# Patient Record
Sex: Female | Born: 1980 | Race: White | Hispanic: No | Marital: Married | Smoking: Never smoker
Health system: Southern US, Community
[De-identification: ages and names within clinical notes are randomized; demographics above are authoritative.]

## PROBLEM LIST (undated history)

## (undated) DIAGNOSIS — K429 Umbilical hernia without obstruction or gangrene: Secondary | ICD-10-CM

## (undated) HISTORY — PX: FINGER SURGERY: SHX640

---
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
## 2004-03-09 ENCOUNTER — Ambulatory Visit (HOSPITAL_COMMUNITY): Admission: RE | Admit: 2004-03-09 | Discharge: 2004-03-09 | Payer: Self-pay | Admitting: Family Medicine

## 2004-08-03 ENCOUNTER — Other Ambulatory Visit: Admission: RE | Admit: 2004-08-03 | Discharge: 2004-08-03 | Payer: Self-pay | Admitting: Family Medicine

## 2004-08-20 HISTORY — PX: APPENDECTOMY: SHX54

## 2005-02-28 ENCOUNTER — Encounter: Admission: RE | Admit: 2005-02-28 | Discharge: 2005-02-28 | Payer: Self-pay | Admitting: Surgery

## 2005-03-01 ENCOUNTER — Ambulatory Visit (HOSPITAL_COMMUNITY): Admission: RE | Admit: 2005-03-01 | Discharge: 2005-03-01 | Payer: Self-pay | Admitting: Surgery

## 2005-03-06 ENCOUNTER — Encounter: Admission: RE | Admit: 2005-03-06 | Discharge: 2005-03-06 | Payer: Self-pay | Admitting: Surgery

## 2005-08-10 ENCOUNTER — Other Ambulatory Visit: Admission: RE | Admit: 2005-08-10 | Discharge: 2005-08-10 | Payer: Self-pay | Admitting: Family Medicine

## 2006-04-30 ENCOUNTER — Ambulatory Visit (HOSPITAL_BASED_OUTPATIENT_CLINIC_OR_DEPARTMENT_OTHER): Admission: RE | Admit: 2006-04-30 | Discharge: 2006-04-30 | Payer: Self-pay | Admitting: Orthopedic Surgery

## 2006-08-12 ENCOUNTER — Other Ambulatory Visit: Admission: RE | Admit: 2006-08-12 | Discharge: 2006-08-12 | Payer: Self-pay | Admitting: Family Medicine

## 2007-08-18 ENCOUNTER — Other Ambulatory Visit: Admission: RE | Admit: 2007-08-18 | Discharge: 2007-08-18 | Payer: Self-pay | Admitting: Family Medicine

## 2008-08-27 ENCOUNTER — Other Ambulatory Visit: Admission: RE | Admit: 2008-08-27 | Discharge: 2008-08-27 | Payer: Self-pay | Admitting: Family Medicine

## 2009-08-29 ENCOUNTER — Other Ambulatory Visit: Admission: RE | Admit: 2009-08-29 | Discharge: 2009-08-29 | Payer: Self-pay | Admitting: Family Medicine

## 2011-09-21 ENCOUNTER — Other Ambulatory Visit (HOSPITAL_COMMUNITY)
Admission: RE | Admit: 2011-09-21 | Discharge: 2011-09-21 | Disposition: A | Discharge status: Home / Self Care | Payer: BC Managed Care – PPO | Source: Ambulatory Visit | Attending: Family Medicine | Admitting: Family Medicine

## 2011-09-21 DIAGNOSIS — Z0000 Encounter for general adult medical examination without abnormal findings: Secondary | ICD-10-CM | POA: Insufficient documentation

## 2012-10-22 ENCOUNTER — Other Ambulatory Visit: Payer: Self-pay | Admitting: Family Medicine

## 2014-03-04 ENCOUNTER — Other Ambulatory Visit (HOSPITAL_COMMUNITY): Payer: Self-pay | Admitting: Obstetrics and Gynecology

## 2014-03-04 DIAGNOSIS — N979 Female infertility, unspecified: Secondary | ICD-10-CM

## 2014-03-09 ENCOUNTER — Ambulatory Visit (HOSPITAL_COMMUNITY)
Admission: RE | Admit: 2014-03-09 | Discharge: 2014-03-09 | Disposition: A | Discharge status: Home / Self Care | Payer: BC Managed Care – PPO | Source: Ambulatory Visit | Attending: Obstetrics and Gynecology | Admitting: Obstetrics and Gynecology

## 2014-03-09 DIAGNOSIS — N979 Female infertility, unspecified: Secondary | ICD-10-CM | POA: Insufficient documentation

## 2014-03-09 MED ORDER — IOHEXOL 300 MG/ML  SOLN
20.0000 mL | Freq: Once | INTRAMUSCULAR | Status: AC | PRN
  Administered 2014-03-09: 18 mL

## 2014-09-04 ENCOUNTER — Emergency Department (HOSPITAL_COMMUNITY)
Admission: EM | Admit: 2014-09-04 | Discharge: 2014-09-04 | Disposition: A | Discharge status: Home / Self Care | Payer: BC Managed Care – PPO | Source: Home / Self Care | Attending: Emergency Medicine | Admitting: Emergency Medicine

## 2014-09-04 ENCOUNTER — Encounter (HOSPITAL_COMMUNITY): Payer: Self-pay | Admitting: Emergency Medicine

## 2014-09-04 DIAGNOSIS — N390 Urinary tract infection, site not specified: Secondary | ICD-10-CM

## 2014-09-04 LAB — POCT URINALYSIS DIP (DEVICE)
Bilirubin Urine: NEGATIVE
Glucose, UA: NEGATIVE mg/dL
Ketones, ur: NEGATIVE mg/dL
NITRITE: POSITIVE — AB
PH: 5.5 (ref 5.0–8.0)
Protein, ur: 30 mg/dL — AB
Specific Gravity, Urine: 1.01 (ref 1.005–1.030)
Urobilinogen, UA: 0.2 mg/dL (ref 0.0–1.0)

## 2014-09-04 MED ORDER — CEPHALEXIN 500 MG PO CAPS: 500.0000 mg | ORAL_CAPSULE | Freq: Two times a day (BID) | ORAL | Status: DC

## 2014-09-04 NOTE — ED Provider Notes (Signed)
CSN: [id number]     Arrival date & time 09/04/14  1836 History   First MD Initiated Contact with Patient 09/04/14 1847     Chief Complaint  Patient presents with  . Back Pain  . Urinary Tract Infection   (Consider location/radiation/quality/duration/timing/severity/associated sxs/prior Treatment) HPI Comments: Teresa Roberson presents [redacted] weeks pregnant with dysuria, odor, frequency and upper back pain. Denies fever, chills or abdominal pain.   Patient is a 34 y.o. female presenting with back pain and urinary tract infection. The history is provided by the patient.  Back Pain Urinary Tract Infection    History reviewed. No pertinent past medical history. History reviewed. No pertinent past surgical history. No family history on file. History  Substance Use Topics  . Smoking status: Not on file  . Smokeless tobacco: Not on file  . Alcohol Use: Not on file   OB History    Gravida Para Term Preterm AB TAB SAB Ectopic Multiple Living   1              Review of Systems  Musculoskeletal: Positive for back pain.  All other systems reviewed and are negative.   Allergies  Review of patient's allergies indicates no known allergies.  Home Medications   Prior to Admission medications   Medication Sig Start Date End Date Taking? Authorizing Provider  cephALEXin (KEFLEX) 500 MG capsule Take 1 capsule (500 mg total) by mouth 2 (two) times daily. 09/04/14   Teresa SheerMichelle G Young, PA-C   BP 117/72 mmHg  Pulse 117  Temp(Src) 98.7 F (37.1 C) (Oral)  Resp 12  SpO2 100%  LMP 07/28/2014 Physical Exam  Constitutional: She is oriented to person, place, and time. She appears well-developed and well-nourished.  HENT:  Head: Normocephalic and atraumatic.  Neurological: She is alert and oriented to person, place, and time.  Skin: Skin is warm and dry.  Psychiatric: Her behavior is normal.  Nursing note and vitals reviewed.   ED Course  Procedures (including critical care time) Labs Review Labs  Reviewed  POCT URINALYSIS DIP (DEVICE) - Abnormal; Notable for the following:    Hgb urine dipstick MODERATE (*)    Protein, ur 30 (*)    Nitrite POSITIVE (*)    Leukocytes, UA LARGE (*)    All other components within normal limits    Imaging Review No results found.   MDM   1. UTI (lower urinary tract infection)    Treat with cephalosporins as safe in pregnancy.  Push fluids/rest.    Teresa SheerMichelle G Young, PA-C 09/04/14 1926

## 2014-09-04 NOTE — ED Notes (Signed)
C/o back pain onset Thursday and poss UTI sx Sx include urinary freq, foul urine odor, dysuria, fevers chills Denies abd pain, hematuria Pt is [redacted] weeks pregnant Alert, no signs of acute distress.

## 2014-09-04 NOTE — Discharge Instructions (Signed)
Pregnancy and Urinary Tract Infection °A urinary tract infection (UTI) is a bacterial infection of the urinary tract. Infection of the urinary tract can include the ureters, kidneys (pyelonephritis), bladder (cystitis), and urethra (urethritis). All pregnant women should be screened for bacteria in the urinary tract. Identifying and treating a UTI will decrease the risk of preterm labor and developing more serious infections in both the mother and baby. °CAUSES °Bacteria germs cause almost all UTIs.  °RISK FACTORS °Many factors can increase your chances of getting a UTI during pregnancy. These include: °· Having a short urethra. °· Poor toilet and hygiene habits. °· Sexual intercourse. °· Blockage of urine along the urinary tract. °· Problems with the pelvic muscles or nerves. °· Diabetes. °· Obesity. °· Bladder problems after having several children. °· Previous history of UTI. °SIGNS AND SYMPTOMS  °· Pain, burning, or a stinging feeling when urinating. °· Suddenly feeling the need to urinate right away (urgency). °· Loss of bladder control (urinary incontinence). °· Frequent urination, more than is common with pregnancy. °· Lower abdominal or back discomfort. °· Cloudy urine. °· Blood in the urine (hematuria). °· Fever.  °When the kidneys are infected, the symptoms may be: °· Back pain. °· Flank pain on the right side more so than the left. °· Fever. °· Chills. °· Nausea. °· Vomiting. °DIAGNOSIS  °A urinary tract infection is usually diagnosed through urine tests. Additional tests and procedures are sometimes done. These may include: °· Ultrasound exam of the kidneys, ureters, bladder, and urethra. °· Looking in the bladder with a lighted tube (cystoscopy). °TREATMENT °Typically, UTIs can be treated with antibiotic medicines.  °HOME CARE INSTRUCTIONS  °· Only take over-the-counter or prescription medicines as directed by your health care provider. If you were prescribed antibiotics, take them as directed. Finish  them even if you start to feel better. °· Drink enough fluids to keep your urine clear or pale yellow. °· Do not have sexual intercourse until the infection is gone and your health care provider says it is okay. °· Make sure you are tested for UTIs throughout your pregnancy. These infections often come back.  °Preventing a UTI in the Future °· Practice good toilet habits. Always wipe from front to back. Use the tissue only once. °· Do not hold your urine. Empty your bladder as soon as possible when the urge comes. °· Do not douche or use deodorant sprays. °· Wash with soap and warm water around the genital area and the anus. °· Empty your bladder before and after sexual intercourse. °· Wear underwear with a cotton crotch. °· Avoid caffeine and carbonated drinks. They can irritate the bladder. °· Drink cranberry juice or take cranberry pills. This may decrease the risk of getting a UTI. °· Do not drink alcohol. °· Keep all your appointments and tests as scheduled.  °SEEK MEDICAL CARE IF:  °· Your symptoms get worse. °· You are still having fevers 2 or more days after treatment begins. °· You have a rash. °· You feel that you are having problems with medicines prescribed. °· You have abnormal vaginal discharge. °SEEK IMMEDIATE MEDICAL CARE IF:  °· You have back or flank pain. °· You have chills. °· You have blood in your urine. °· You have nausea and vomiting. °· You have contractions of your uterus. °· You have a gush of fluid from the vagina. °MAKE SURE YOU: °· Understand these instructions.   °· Will watch your condition.   °· Will get help right away if you are not doing   well or get worse.   °Document Released: 12/01/2010 Document Revised: 05/27/2013 Document Reviewed: 03/05/2013 °[employer]® Patient Information ©2015 [employer]. This information is not intended to replace advice given to you by your health care provider. Make sure you discuss any questions you have with your health care provider. ° °

## 2014-11-27 IMAGING — RF DG HYSTEROGRAM
9 series · 14 of 18 positions shown · non-contrast
Comparison: None.

CLINICAL DATA: Infertility

EXAM:
HYSTEROSALPINGOGRAM
TECHNIQUE: Hysterosalpingogram was performed by the ordering physician under
fluoroscopy. Fluoroscopic images were submitted for radiologic
interpretation following the procedure. Please see the procedural
report for the amount of contrast and the fluoroscopy time utilized.

[Series 1: run · 2 of 2 slices shown (1 of 9)]
[im 1/2]
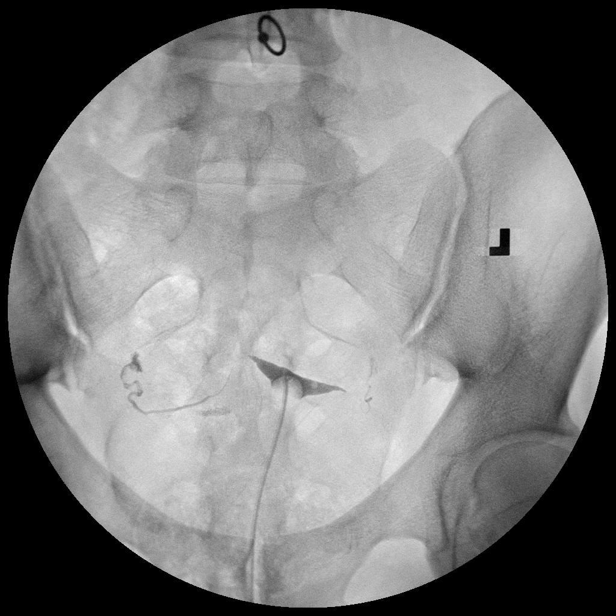
[im 2/2]
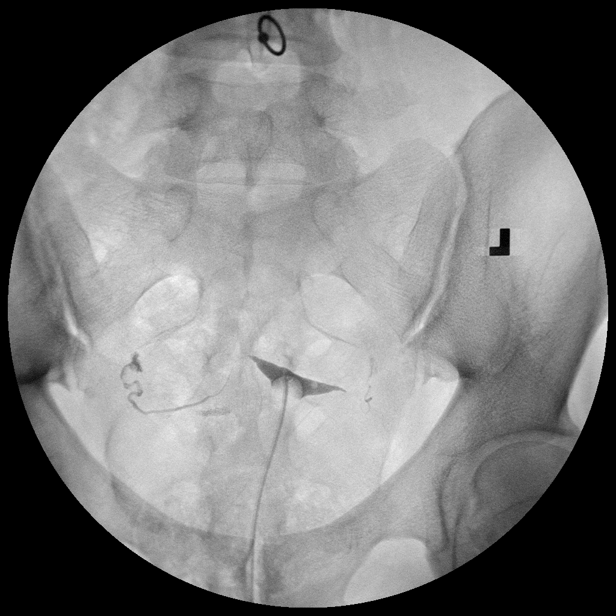

[Series 2: run · 1 of 2 slices shown (2 of 9)]
[im 2/2]
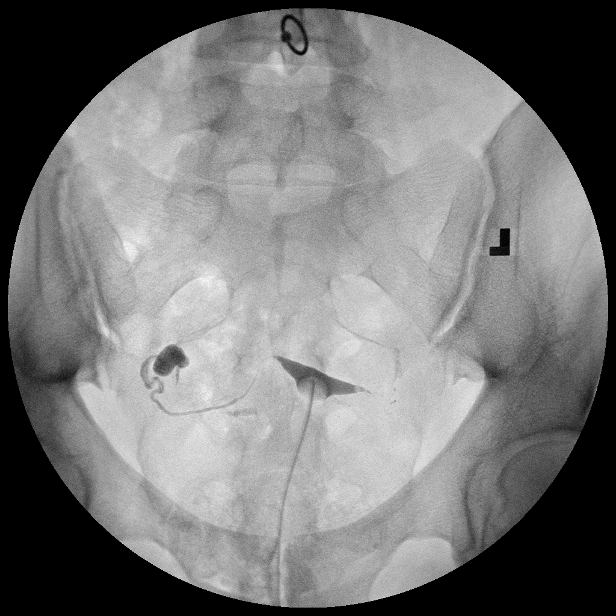

[Series 3: run · 2 of 2 slices shown (3 of 9)]
[im 1/2]
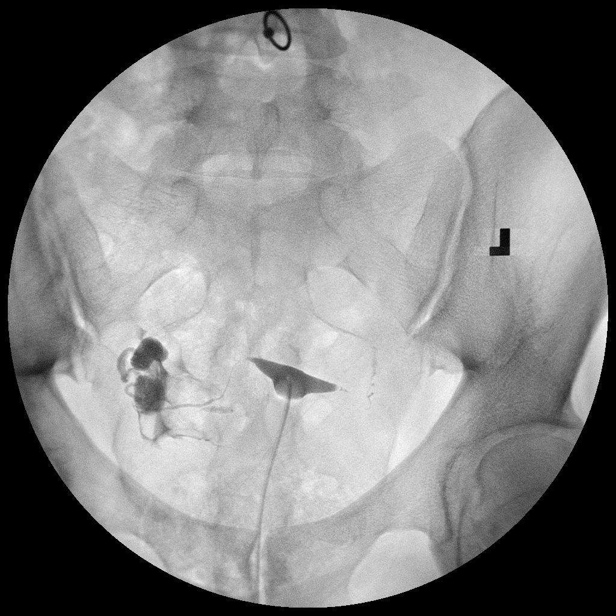
[im 2/2]
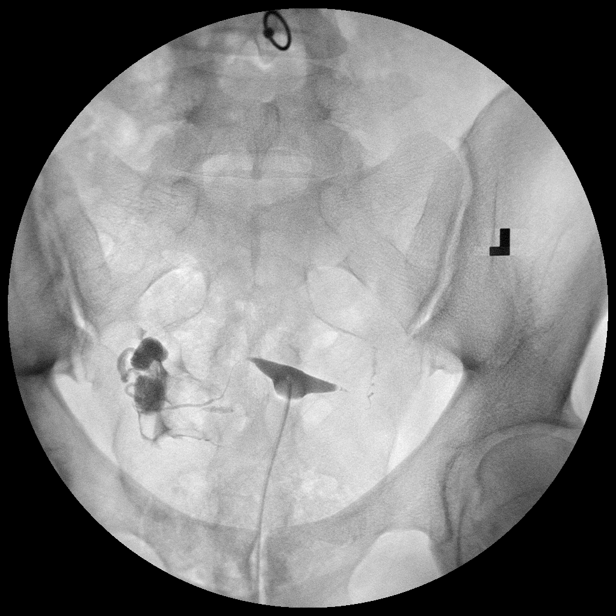

[Series 4: run · 1 of 2 slices shown (4 of 9)]
[im 2/2]
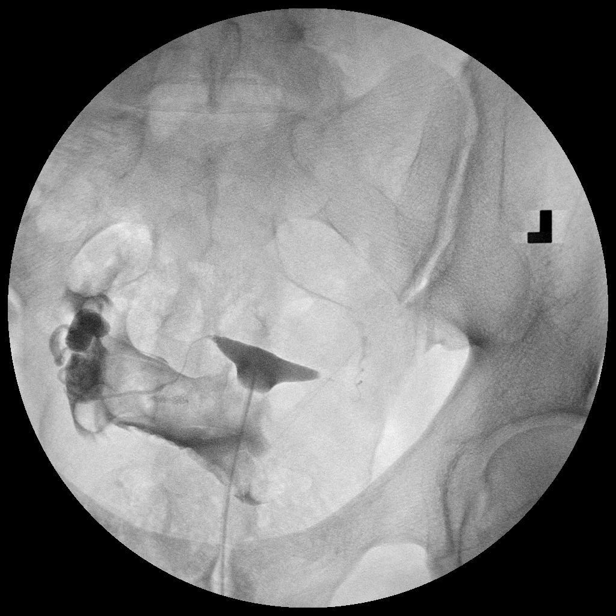

[Series 5: run · 2 of 2 slices shown (5 of 9)]
[im 1/2]
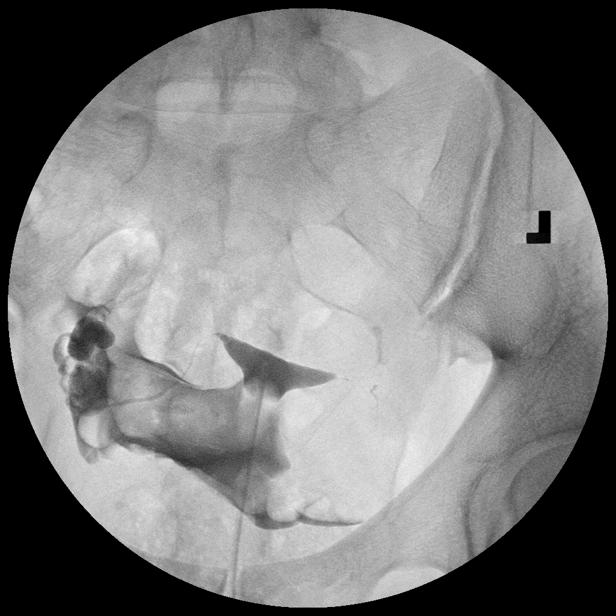
[im 2/2]
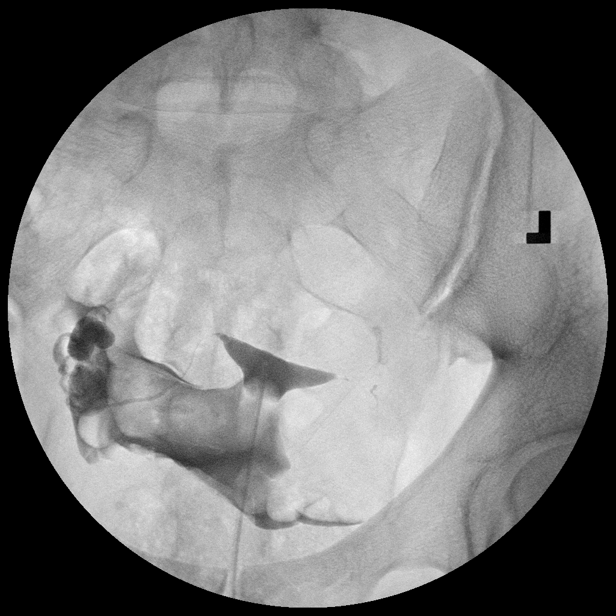

[Series 6: run · 1 of 2 slices shown (6 of 9)]
[im 1/2]
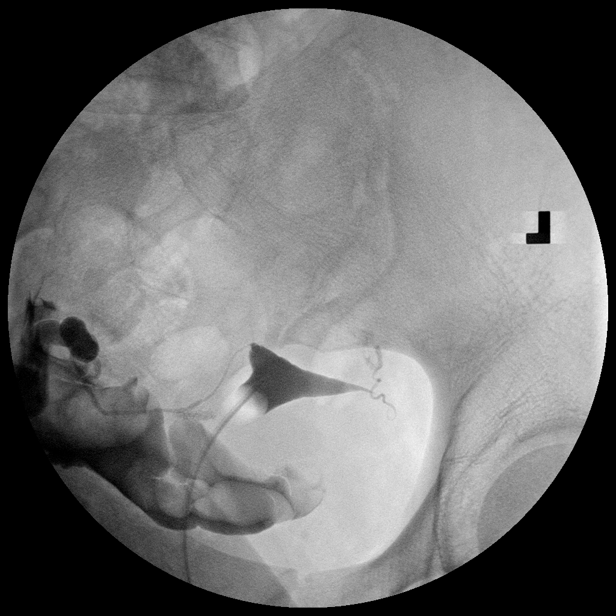

[Series 7: run · 2 of 2 slices shown (7 of 9)]
[im 1/2]
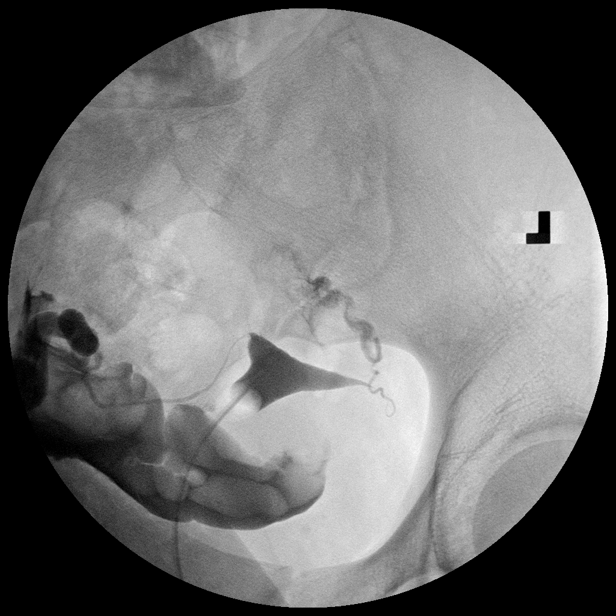
[im 2/2]
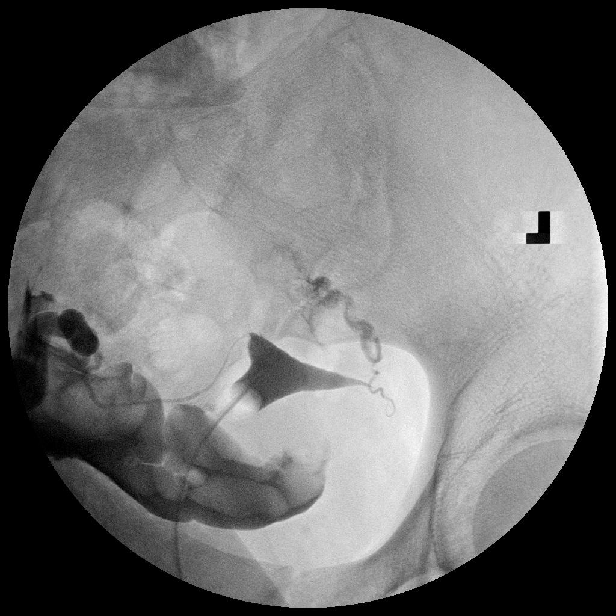

[Series 8: run · 1 of 2 slices shown (8 of 9)]
[im 1/2]
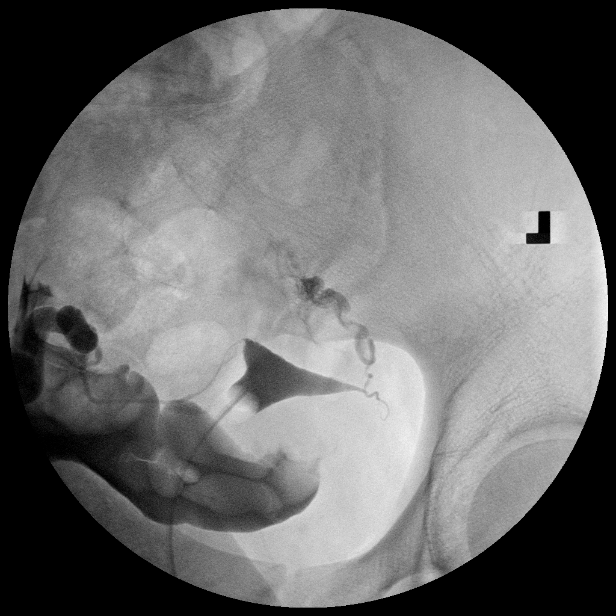

[Series 9: run · 2 of 2 slices shown (9 of 9)]
[im 1/2]
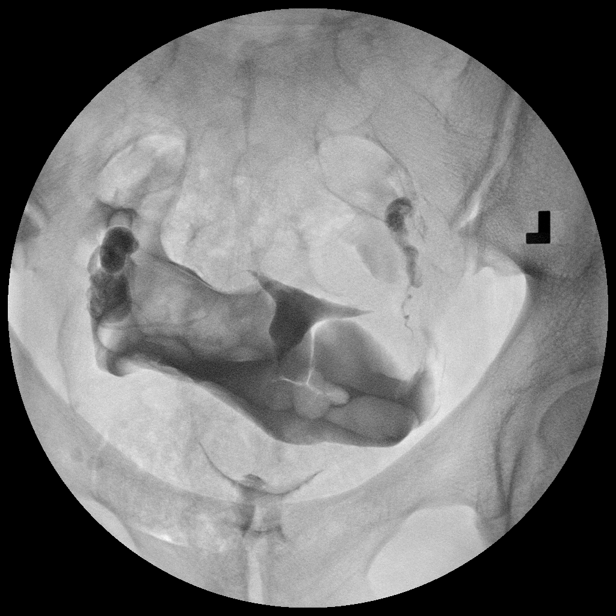
[im 2/2]
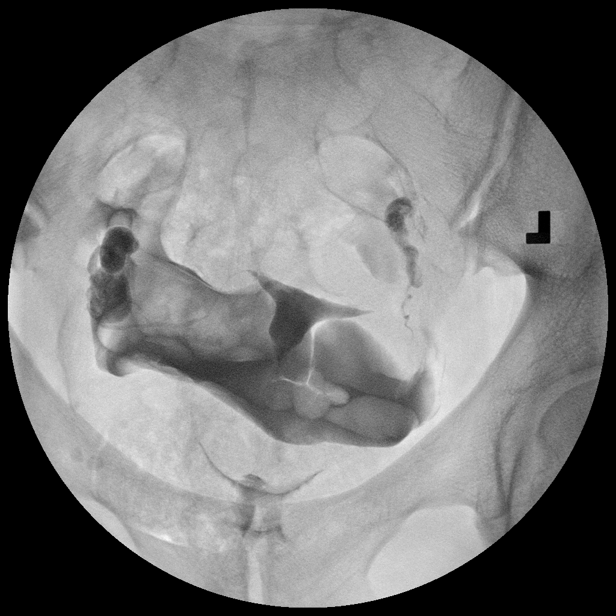

[14 of 18 positions shown; findings below may reference images not displayed]

FINDINGS: The endometrial cavity appears normal. There is prompt filling and
spillage of the right fallopian tube which appears normal. Slower
filling of the left fallopian tube. The proximal aspect of the left
fallopian tube is never well visualized. There is eventual spillage
of contrast from the left fallopian tube.
IMPRESSION: Normal right fallopian tube.

Delayed filling of the left fallopian tube with incomplete
visualization of the proximal aspect of the left fallopian tube,
possibly related to spasm. Eventual spillage is noted from the left
fallopian tube.

## 2014-12-23 ENCOUNTER — Inpatient Hospital Stay (HOSPITAL_COMMUNITY)
Admission: AD | Admit: 2014-12-23 | Payer: BC Managed Care – PPO | Source: Ambulatory Visit | Admitting: Obstetrics and Gynecology

## 2015-05-06 ENCOUNTER — Encounter (HOSPITAL_COMMUNITY): Payer: Self-pay | Admitting: *Deleted

## 2015-05-06 ENCOUNTER — Other Ambulatory Visit: Payer: Self-pay | Admitting: Obstetrics and Gynecology

## 2015-05-06 NOTE — H&P (Signed)
Teresa Roberson is a 34 y.o. G 1 P 0 at 40 w 5 days presents for Primary LTCS for Macrosomia.  Patient seen in office on Friday - Ultrasound EFW 9 pound 5 oz and AC measured 41 and 1/2 weeks.  Cervix is long / closed.  Patient counseled about risks of macrosomia, induction, shoulder dystocia and unfavorable cervix.  She and her husband elected to go with C Section. History OB History    Gravida Para Term Preterm AB TAB SAB Ectopic Multiple Living   1              No past medical history on file. No past surgical history on file. Family History: family history is not on file. Social History:  has no tobacco, alcohol, and drug history on file.   Prenatal Transfer Tool  Maternal Diabetes: No Genetic Screening: Normal Maternal Ultrasounds/Referrals: Normal Fetal Ultrasounds or other Referrals:  None Maternal Substance Abuse:  No Significant Maternal Medications:  None Significant Maternal Lab Results:  None Other Comments:  None  Review of Systems  All other systems reviewed and are negative.     Last menstrual period 07/28/2014. Maternal Exam:  Abdomen: Fetal presentation: vertex     Physical Exam  Nursing note and vitals reviewed. Constitutional: She appears well-developed.  HENT:  Head: Normocephalic.  Eyes: Pupils are equal, round, and reactive to light.  Neck: Normal range of motion.  Cardiovascular: Normal rate and regular rhythm.   Respiratory: Effort normal.  GI: Soft.  Genitourinary: Vagina normal.    Prenatal labs: ABO, Rh:   Antibody:   Rubella:   RPR:    HBsAg:    HIV:    GBS:     Assessment/Plan: IUP at 40 w 5 days Macrosomia Unfavorable cervix Proceed with Primary LTCS  Risks reviewed with patient Consent obtained GREWAL,MICHELLE L 05/06/2015, 3:02 PM

## 2015-05-08 ENCOUNTER — Inpatient Hospital Stay (HOSPITAL_COMMUNITY)
Admission: AD | Admit: 2015-05-08 | Discharge: 2015-05-10 | DRG: 766 | Disposition: A | Discharge status: Home / Self Care | Payer: BC Managed Care – PPO | Source: Ambulatory Visit | Attending: Obstetrics and Gynecology | Admitting: Obstetrics and Gynecology

## 2015-05-08 ENCOUNTER — Inpatient Hospital Stay (HOSPITAL_COMMUNITY): Payer: BC Managed Care – PPO | Admitting: Anesthesiology

## 2015-05-08 ENCOUNTER — Encounter (HOSPITAL_COMMUNITY)
Admission: AD | Disposition: A | Discharge status: Home / Self Care | Payer: Self-pay | Source: Ambulatory Visit | Attending: Obstetrics and Gynecology

## 2015-05-08 ENCOUNTER — Encounter (HOSPITAL_COMMUNITY): Payer: Self-pay | Admitting: *Deleted

## 2015-05-08 DIAGNOSIS — Z98891 History of uterine scar from previous surgery: Secondary | ICD-10-CM

## 2015-05-08 DIAGNOSIS — O3663X0 Maternal care for excessive fetal growth, third trimester, not applicable or unspecified: Principal | ICD-10-CM | POA: Diagnosis present

## 2015-05-08 DIAGNOSIS — Z3A40 40 weeks gestation of pregnancy: Secondary | ICD-10-CM | POA: Diagnosis present

## 2015-05-08 LAB — CBC
HEMATOCRIT: 37.2 % (ref 36.0–46.0)
HEMOGLOBIN: 12.5 g/dL (ref 12.0–15.0)
MCH: 29.5 pg (ref 26.0–34.0)
MCHC: 33.6 g/dL (ref 30.0–36.0)
MCV: 87.7 fL (ref 78.0–100.0)
Platelets: 169 10*3/uL (ref 150–400)
RBC: 4.24 MIL/uL (ref 3.87–5.11)
RDW: 14 % (ref 11.5–15.5)
WBC: 10.9 10*3/uL — ABNORMAL HIGH (ref 4.0–10.5)

## 2015-05-08 LAB — TYPE AND SCREEN
ABO/RH(D): A POS
ANTIBODY SCREEN: NEGATIVE

## 2015-05-08 LAB — ABO/RH: ABO/RH(D): A POS

## 2015-05-08 SURGERY — Surgical Case
Anesthesia: Spinal

## 2015-05-08 MED ORDER — NALBUPHINE HCL 10 MG/ML IJ SOLN
5.0000 mg | INTRAMUSCULAR | Status: DC | PRN
  Filled 2015-05-08: qty 0.5

## 2015-05-08 MED ORDER — LACTATED RINGERS IV SOLN
INTRAVENOUS | Status: DC
  Administered 2015-05-08 (×3): via INTRAVENOUS

## 2015-05-08 MED ORDER — BUPIVACAINE HCL (PF) 0.25 % IJ SOLN
INTRAMUSCULAR | Status: AC
  Filled 2015-05-08: qty 30

## 2015-05-08 MED ORDER — SODIUM CHLORIDE 0.9 % IJ SOLN: 3.0000 mL | INTRAMUSCULAR | Status: DC | PRN

## 2015-05-08 MED ORDER — FENTANYL CITRATE (PF) 100 MCG/2ML IJ SOLN
INTRAMUSCULAR | Status: DC | PRN
  Administered 2015-05-08: 20 ug via INTRATHECAL

## 2015-05-08 MED ORDER — NALOXONE HCL 0.4 MG/ML IJ SOLN: 0.4000 mg | INTRAMUSCULAR | Status: DC | PRN

## 2015-05-08 MED ORDER — OXYCODONE-ACETAMINOPHEN 5-325 MG PO TABS
2.0000 | ORAL_TABLET | ORAL | Status: DC | PRN
Start: 2015-05-08 — End: 2015-05-10
  Administered 2015-05-10: 2 via ORAL
  Filled 2015-05-08: qty 2

## 2015-05-08 MED ORDER — ONDANSETRON HCL 4 MG/2ML IJ SOLN
INTRAMUSCULAR | Status: DC | PRN
  Administered 2015-05-08: 4 mg via INTRAVENOUS

## 2015-05-08 MED ORDER — PHENYLEPHRINE 8 MG IN D5W 100 ML (0.08MG/ML) PREMIX OPTIME
INJECTION | INTRAVENOUS | Status: DC | PRN
  Administered 2015-05-08: 50 ug/min via INTRAVENOUS

## 2015-05-08 MED ORDER — SCOPOLAMINE 1 MG/3DAYS TD PT72
MEDICATED_PATCH | TRANSDERMAL | Status: AC
  Filled 2015-05-08: qty 1

## 2015-05-08 MED ORDER — CEFAZOLIN SODIUM-DEXTROSE 2-3 GM-% IV SOLR
2.0000 g | INTRAVENOUS | Status: AC
  Administered 2015-05-08: 2 g via INTRAVENOUS
  Filled 2015-05-08: qty 50

## 2015-05-08 MED ORDER — ACETAMINOPHEN 325 MG PO TABS: 650.0000 mg | ORAL_TABLET | ORAL | Status: DC | PRN

## 2015-05-08 MED ORDER — 0.9 % SODIUM CHLORIDE (POUR BTL) OPTIME
TOPICAL | Status: DC | PRN
  Administered 2015-05-08: 1000 mL

## 2015-05-08 MED ORDER — KETOROLAC TROMETHAMINE 30 MG/ML IJ SOLN: 30.0000 mg | Freq: Once | INTRAMUSCULAR | Status: DC | PRN

## 2015-05-08 MED ORDER — OXYTOCIN 10 UNIT/ML IJ SOLN
INTRAMUSCULAR | Status: AC
  Filled 2015-05-08: qty 4

## 2015-05-08 MED ORDER — HYDROMORPHONE HCL 1 MG/ML IJ SOLN: 0.2500 mg | INTRAMUSCULAR | Status: DC | PRN

## 2015-05-08 MED ORDER — SIMETHICONE 80 MG PO CHEW
80.0000 mg | CHEWABLE_TABLET | ORAL | Status: DC
  Administered 2015-05-09 (×2): 80 mg via ORAL
  Filled 2015-05-08 (×2): qty 1

## 2015-05-08 MED ORDER — NALBUPHINE HCL 10 MG/ML IJ SOLN
5.0000 mg | Freq: Once | INTRAMUSCULAR | Status: DC | PRN
  Filled 2015-05-08: qty 0.5

## 2015-05-08 MED ORDER — LACTATED RINGERS IV SOLN: INTRAVENOUS | Status: DC

## 2015-05-08 MED ORDER — PRENATAL MULTIVITAMIN CH
1.0000 | ORAL_TABLET | Freq: Every day | ORAL | Status: DC
  Administered 2015-05-09: 1 via ORAL
  Filled 2015-05-08: qty 1

## 2015-05-08 MED ORDER — SIMETHICONE 80 MG PO CHEW: 80.0000 mg | CHEWABLE_TABLET | ORAL | Status: DC | PRN

## 2015-05-08 MED ORDER — LACTATED RINGERS IV SOLN
INTRAVENOUS | Status: DC | PRN
  Administered 2015-05-08: 07:00:00 via INTRAVENOUS

## 2015-05-08 MED ORDER — MENTHOL 3 MG MT LOZG
1.0000 | LOZENGE | OROMUCOSAL | Status: DC | PRN
Start: 2015-05-08 — End: 2015-05-10

## 2015-05-08 MED ORDER — MEPERIDINE HCL 25 MG/ML IJ SOLN: 6.2500 mg | INTRAMUSCULAR | Status: DC | PRN

## 2015-05-08 MED ORDER — SENNOSIDES-DOCUSATE SODIUM 8.6-50 MG PO TABS
2.0000 | ORAL_TABLET | ORAL | Status: DC
  Administered 2015-05-08 – 2015-05-09 (×2): 2 via ORAL
  Filled 2015-05-08 (×2): qty 2

## 2015-05-08 MED ORDER — KETOROLAC TROMETHAMINE 30 MG/ML IJ SOLN
30.0000 mg | Freq: Four times a day (QID) | INTRAMUSCULAR | Status: DC | PRN
  Administered 2015-05-08: 30 mg via INTRAMUSCULAR

## 2015-05-08 MED ORDER — WITCH HAZEL-GLYCERIN EX PADS: 1.0000 "application " | MEDICATED_PAD | CUTANEOUS | Status: DC | PRN

## 2015-05-08 MED ORDER — CITRIC ACID-SODIUM CITRATE 334-500 MG/5ML PO SOLN
30.0000 mL | Freq: Once | ORAL | Status: AC
  Administered 2015-05-08: 30 mL via ORAL
  Filled 2015-05-08: qty 15

## 2015-05-08 MED ORDER — LACTATED RINGERS IV BOLUS (SEPSIS)
1000.0000 mL | Freq: Once | INTRAVENOUS | Status: AC
  Administered 2015-05-08: 1000 mL via INTRAVENOUS

## 2015-05-08 MED ORDER — SCOPOLAMINE 1 MG/3DAYS TD PT72
1.0000 | MEDICATED_PATCH | Freq: Once | TRANSDERMAL | Status: DC
  Administered 2015-05-08: 1.5 mg via TRANSDERMAL

## 2015-05-08 MED ORDER — OXYTOCIN 10 UNIT/ML IJ SOLN
40.0000 [IU] | INTRAVENOUS | Status: DC | PRN
  Administered 2015-05-08: 40 [IU] via INTRAVENOUS

## 2015-05-08 MED ORDER — PROMETHAZINE HCL 25 MG/ML IJ SOLN: 6.2500 mg | INTRAMUSCULAR | Status: DC | PRN

## 2015-05-08 MED ORDER — BUPIVACAINE IN DEXTROSE 0.75-8.25 % IT SOLN
INTRATHECAL | Status: DC | PRN
  Administered 2015-05-08: 10.5 mg via INTRATHECAL

## 2015-05-08 MED ORDER — TETANUS-DIPHTH-ACELL PERTUSSIS 5-2.5-18.5 LF-MCG/0.5 IM SUSP: 0.5000 mL | Freq: Once | INTRAMUSCULAR | Status: DC

## 2015-05-08 MED ORDER — ACETAMINOPHEN 500 MG PO TABS
1000.0000 mg | ORAL_TABLET | Freq: Four times a day (QID) | ORAL | Status: AC
  Administered 2015-05-09: 1000 mg via ORAL
  Filled 2015-05-08 (×2): qty 2

## 2015-05-08 MED ORDER — ONDANSETRON HCL 4 MG/2ML IJ SOLN
INTRAMUSCULAR | Status: AC
  Filled 2015-05-08: qty 2

## 2015-05-08 MED ORDER — DIBUCAINE 1 % RE OINT: 1.0000 "application " | TOPICAL_OINTMENT | RECTAL | Status: DC | PRN

## 2015-05-08 MED ORDER — LACTATED RINGERS IV SOLN
INTRAVENOUS | Status: DC
Start: 2015-05-08 — End: 2015-05-10
  Administered 2015-05-08: 20:00:00 via INTRAVENOUS

## 2015-05-08 MED ORDER — KETOROLAC TROMETHAMINE 30 MG/ML IJ SOLN
INTRAMUSCULAR | Status: AC
  Filled 2015-05-08: qty 1

## 2015-05-08 MED ORDER — OXYCODONE-ACETAMINOPHEN 5-325 MG PO TABS: 1.0000 | ORAL_TABLET | ORAL | Status: DC | PRN

## 2015-05-08 MED ORDER — DIPHENHYDRAMINE HCL 25 MG PO CAPS: 25.0000 mg | ORAL_CAPSULE | ORAL | Status: DC | PRN

## 2015-05-08 MED ORDER — SCOPOLAMINE 1 MG/3DAYS TD PT72: 1.0000 | MEDICATED_PATCH | Freq: Once | TRANSDERMAL | Status: DC

## 2015-05-08 MED ORDER — INFLUENZA VAC SPLIT QUAD 0.5 ML IM SUSY
0.5000 mL | PREFILLED_SYRINGE | INTRAMUSCULAR | Status: AC
  Administered 2015-05-09: 0.5 mL via INTRAMUSCULAR
  Filled 2015-05-08: qty 0.5

## 2015-05-08 MED ORDER — ONDANSETRON HCL 4 MG/2ML IJ SOLN: 4.0000 mg | Freq: Three times a day (TID) | INTRAMUSCULAR | Status: DC | PRN

## 2015-05-08 MED ORDER — FAMOTIDINE IN NACL 20-0.9 MG/50ML-% IV SOLN
20.0000 mg | Freq: Once | INTRAVENOUS | Status: AC
  Administered 2015-05-08: 20 mg via INTRAVENOUS
  Filled 2015-05-08: qty 50

## 2015-05-08 MED ORDER — MORPHINE SULFATE (PF) 0.5 MG/ML IJ SOLN
INTRAMUSCULAR | Status: AC
  Filled 2015-05-08: qty 100

## 2015-05-08 MED ORDER — DIPHENHYDRAMINE HCL 25 MG PO CAPS: 25.0000 mg | ORAL_CAPSULE | Freq: Four times a day (QID) | ORAL | Status: DC | PRN

## 2015-05-08 MED ORDER — DIPHENHYDRAMINE HCL 50 MG/ML IJ SOLN: 12.5000 mg | INTRAMUSCULAR | Status: DC | PRN

## 2015-05-08 MED ORDER — MORPHINE SULFATE (PF) 0.5 MG/ML IJ SOLN
INTRAMUSCULAR | Status: DC | PRN
  Administered 2015-05-08: .2 mg via INTRATHECAL

## 2015-05-08 MED ORDER — FENTANYL CITRATE (PF) 100 MCG/2ML IJ SOLN
INTRAMUSCULAR | Status: AC
  Filled 2015-05-08: qty 4

## 2015-05-08 MED ORDER — KETOROLAC TROMETHAMINE 30 MG/ML IJ SOLN: 30.0000 mg | Freq: Four times a day (QID) | INTRAMUSCULAR | Status: DC | PRN

## 2015-05-08 MED ORDER — OXYTOCIN 40 UNITS IN LACTATED RINGERS INFUSION - SIMPLE MED: 62.5000 mL/h | INTRAVENOUS | Status: AC

## 2015-05-08 MED ORDER — IBUPROFEN 600 MG PO TABS: 600.0000 mg | ORAL_TABLET | Freq: Four times a day (QID) | ORAL | Status: DC | PRN

## 2015-05-08 MED ORDER — NALOXONE HCL 1 MG/ML IJ SOLN
1.0000 ug/kg/h | INTRAMUSCULAR | Status: DC | PRN
  Filled 2015-05-08: qty 2

## 2015-05-08 MED ORDER — SIMETHICONE 80 MG PO CHEW
80.0000 mg | CHEWABLE_TABLET | Freq: Three times a day (TID) | ORAL | Status: DC
  Administered 2015-05-08 – 2015-05-10 (×5): 80 mg via ORAL
  Filled 2015-05-08 (×5): qty 1

## 2015-05-08 MED ORDER — PHENYLEPHRINE 8 MG IN D5W 100 ML (0.08MG/ML) PREMIX OPTIME
INJECTION | INTRAVENOUS | Status: AC
  Filled 2015-05-08: qty 100

## 2015-05-08 MED ORDER — LANOLIN HYDROUS EX OINT: 1.0000 "application " | TOPICAL_OINTMENT | CUTANEOUS | Status: DC | PRN

## 2015-05-08 MED ORDER — ZOLPIDEM TARTRATE 5 MG PO TABS: 5.0000 mg | ORAL_TABLET | Freq: Every evening | ORAL | Status: DC | PRN

## 2015-05-08 MED ORDER — IBUPROFEN 600 MG PO TABS
600.0000 mg | ORAL_TABLET | Freq: Four times a day (QID) | ORAL | Status: DC
  Administered 2015-05-08 – 2015-05-10 (×8): 600 mg via ORAL
  Filled 2015-05-08 (×8): qty 1

## 2015-05-08 SURGICAL SUPPLY — 30 items
BARRIER ADHS 3X4 INTERCEED (GAUZE/BANDAGES/DRESSINGS) IMPLANT
CLAMP CORD UMBIL (MISCELLANEOUS) IMPLANT
CLOTH BEACON ORANGE TIMEOUT ST (SAFETY) ×3 IMPLANT
CONTAINER PREFILL 10% NBF 15ML (MISCELLANEOUS) IMPLANT
DRAPE SHEET LG 3/4 BI-LAMINATE (DRAPES) IMPLANT
DRSG OPSITE POSTOP 4X10 (GAUZE/BANDAGES/DRESSINGS) ×3 IMPLANT
DURAPREP 26ML APPLICATOR (WOUND CARE) ×3 IMPLANT
ELECT REM PT RETURN 9FT ADLT (ELECTROSURGICAL) ×3
ELECTRODE REM PT RTRN 9FT ADLT (ELECTROSURGICAL) ×1 IMPLANT
EXTRACTOR VACUUM M CUP 4 TUBE (SUCTIONS) IMPLANT
EXTRACTOR VACUUM M CUP 4' TUBE (SUCTIONS)
GLOVE BIO SURGEON STRL SZ 6.5 (GLOVE) ×2 IMPLANT
GLOVE BIO SURGEONS STRL SZ 6.5 (GLOVE) ×1
GOWN STRL REUS W/TWL LRG LVL3 (GOWN DISPOSABLE) ×6 IMPLANT
KIT ABG SYR 3ML LUER SLIP (SYRINGE) IMPLANT
NEEDLE HYPO 22GX1.5 SAFETY (NEEDLE) IMPLANT
NEEDLE HYPO 25X5/8 SAFETYGLIDE (NEEDLE) ×3 IMPLANT
NS IRRIG 1000ML POUR BTL (IV SOLUTION) ×3 IMPLANT
PACK C SECTION WH (CUSTOM PROCEDURE TRAY) ×3 IMPLANT
PAD OB MATERNITY 4.3X12.25 (PERSONAL CARE ITEMS) ×3 IMPLANT
PENCIL SMOKE EVAC W/HOLSTER (ELECTROSURGICAL) ×3 IMPLANT
SUT CHROMIC 0 CTX 36 (SUTURE) ×6 IMPLANT
SUT PLAIN 0 NONE (SUTURE) IMPLANT
SUT PLAIN 2 0 XLH (SUTURE) IMPLANT
SUT VIC AB 0 CT1 27 (SUTURE) ×6
SUT VIC AB 0 CT1 27XBRD ANBCTR (SUTURE) ×3 IMPLANT
SUT VIC AB 4-0 KS 27 (SUTURE) ×3 IMPLANT
SYR CONTROL 10ML LL (SYRINGE) IMPLANT
TOWEL OR 17X24 6PK STRL BLUE (TOWEL DISPOSABLE) ×3 IMPLANT
TRAY FOLEY CATH SILVER 14FR (SET/KITS/TRAYS/PACK) ×3 IMPLANT

## 2015-05-08 NOTE — Brief Op Note (Signed)
05/08/2015  7:22 AM  PATIENT:  Teresa Roberson  34 y.o. female  PRE-OPERATIVE DIAGNOSIS:   IUP at 40 w 4 days Labor Macrosomia  POST-OPERATIVE DIAGNOSIS:  Same  PROCEDURE:  Procedure(s): CESAREAN SECTION (N/A)  SURGEON:  Surgeon(s) and Role:    * Marcelle Overlie, MD - Primary  PHYSICIAN ASSISTANT:   ASSISTANTS: none   ANESTHESIA:   spinal  EBL:  Total I/O In: -  Out: 650 [Urine:150; Blood:500]  BLOOD ADMINISTERED:none  DRAINS: Urinary Catheter (Foley)   LOCAL MEDICATIONS USED:  NONE  SPECIMEN:  No Specimen  DISPOSITION OF SPECIMEN:  N/A  COUNTS:  YES  TOURNIQUET:  * No tourniquets in log *  DICTATION: .Other Dictation: Dictation Number [id number]  PLAN OF CARE: Admit to inpatient   PATIENT DISPOSITION:  PACU - hemodynamically stable.   Delay start of Pharmacological VTE agent (>24hrs) due to surgical blood loss or risk of bleeding: not applicable

## 2015-05-08 NOTE — Op Note (Signed)
Teresa Roberson, Teresa Roberson             ACCOUNT NO.:  [id number]  MEDICAL RECORD NO.:  [id number]  LOCATION:  [facility]                          FACILITY:  [facility]  PHYSICIAN:  Emilie Carp L. Ashani Pumphrey, M.D.DATE OF BIRTH:  Oct 12, 1980  DATE OF PROCEDURE:  05/08/2015 DATE OF DISCHARGE:                              OPERATIVE REPORT   PREOPERATIVE DIAGNOSIS:  Intrauterine pregnancy at 40 weeks and 4 days and labor and macrosomia.  POSTOPERATIVE DIAGNOSIS:  Intrauterine pregnancy at 40 weeks and 4 days and labor and macrosomia.  PROCEDURE:  Primary low transverse cesarean section.  SURGEON:  Lyndsee Casa L. Tomicka Lover, MD  ANESTHESIA:  Spinal.  EBL:  Less than 500 mL.  COMPLICATIONS:  None.  PROCEDURE IN DETAIL:  Patient was taken to the operating room.  She was prepped and draped after spinal was placed.  A low transverse incision was made, carried down to the fascia.  Fascia was scored in the midline extended laterally.  Rectus muscles were separated in the midline.  The peritoneum was entered bluntly.  The peritoneal incision was then stretched.  The lower uterine segment was identified.  The bladder flap was created sharply and then digitally.  The bladder blade was then reinserted.  A low transverse incision was made in the uterus.  Uterus was entered using hemostat.  The lower uterine segment was edematous. Amniotic fluid was clear.  The baby was delivered easily with a female infant, Apgars 9 at 1 minute and 9 at 5 minutes and was a female infant. The baby was large for gestational age with a weight of 9 pounds 1 ounce.  Cord was clamped and cut.  The baby was handed to the awaiting neonatal team.  The placenta was manually removed, noted to be normal intact with a three-vessel cord.  The uterus was exteriorized and cleared of all clots and debris.  The uterine incision was closed in 1 layer using 0 chromic in a running, locked stitch.  The uterus was returned to the abdomen.  Irrigation was performed.   Hemostasis was noted.  The peritoneum was closed using 0 Vicryl.  The fascia was closed using 0 Vicryl starting each corner meeting in the midline.  After irrigation of subcutaneous layer, the skin was closed with a 4-0 Vicryl on a Keith needle.  A honeycomb and dressing were applied.  All sponge, lap, and instrument counts were correct x2.  Patient went to recovery room in stable condition.    Kenecia Barren L. Vincente Poli, M.D.    Florestine Avers  D:  05/08/2015  T:  05/08/2015  Job:  [id number]

## 2015-05-08 NOTE — Anesthesia Preprocedure Evaluation (Signed)
Anesthesia Evaluation  Patient identified by MRN, date of birth, ID band Patient awake    Reviewed: Allergy & Precautions, H&P , NPO status , Patient's Chart, lab work & pertinent test results  Airway Mallampati: I  TM Distance: >3 FB Neck ROM: full    Dental no notable dental hx.    Pulmonary neg pulmonary ROS,    Pulmonary exam normal        Cardiovascular negative cardio ROS Normal cardiovascular exam     Neuro/Psych negative neurological ROS  negative psych ROS   GI/Hepatic negative GI ROS, Neg liver ROS,   Endo/Other  negative endocrine ROS  Renal/GU negative Renal ROS     Musculoskeletal   Abdominal (+) + obese,   Peds  Hematology negative hematology ROS (+)   Anesthesia Other Findings   Reproductive/Obstetrics (+) Pregnancy                             Anesthesia Physical Anesthesia Plan  ASA: II  Anesthesia Plan: Spinal   Post-op Pain Management:    Induction:   Airway Management Planned:   Additional Equipment:   Intra-op Plan:   Post-operative Plan:   Informed Consent: I have reviewed the patients History and Physical, chart, labs and discussed the procedure including the risks, benefits and alternatives for the proposed anesthesia with the patient or authorized representative who has indicated his/her understanding and acceptance.     Plan Discussed with: CRNA and Surgeon  Anesthesia Plan Comments:        Anesthesia Quick Evaluation  

## 2015-05-08 NOTE — Transfer of Care (Signed)
Immediate Anesthesia Transfer of Care Note  Patient: Teresa Roberson  Procedure(s) Performed: Procedure(s): CESAREAN SECTION (N/A)  Patient Location: PACU  Anesthesia Type:Spinal  Level of Consciousness: awake, alert  and oriented  Airway & Oxygen Therapy: Patient Spontanous Breathing  Post-op Assessment: Report given to RN and Post -op Vital signs reviewed and stable  Post vital signs: Reviewed and stable  Last Vitals:  Filed Vitals:   05/08/15 0622  BP: 132/83  Pulse: 89  Temp: 36.7 C  Resp: 16    Complications: No apparent anesthesia complications

## 2015-05-08 NOTE — MAU Note (Signed)
PT  SAYS  SHE STARTED  HURTING  BAD AT 0300.  VE IN OFFICE  - CLOSED.  [facility]  FOR  C/S ON  MON-  FOR LARGE   BABY.   DENIES HSV AND MRSA. GBS-  POSITIVE

## 2015-05-08 NOTE — Anesthesia Postprocedure Evaluation (Signed)
  Anesthesia Post-op Note  Patient: Teresa Roberson  Procedure(s) Performed: Procedure(s) (LRB): CESAREAN SECTION (N/A)  Patient Location: PACU  Anesthesia Type: Spinal  Level of Consciousness: awake and alert   Airway and Oxygen Therapy: Patient Spontanous Breathing  Post-op Pain: mild  Post-op Assessment: Post-op Vital signs reviewed, Patient's Cardiovascular Status Stable, Respiratory Function Stable, Patent Airway and No signs of Nausea or vomiting  Last Vitals:  Filed Vitals:   05/08/15 0815  BP: 123/66  Pulse: 68  Temp:   Resp: 16    Post-op Vital Signs: stable   Complications: No apparent anesthesia complications

## 2015-05-08 NOTE — Progress Notes (Signed)
Patient presents this am in early labor.  Will proceed with Primary LTCS H and P on the chart No changes except above. Consent signed

## 2015-05-08 NOTE — Anesthesia Procedure Notes (Signed)
Spinal Patient location during procedure: OR Start time: 05/08/2015 6:39 AM End time: 05/08/2015 6:42 AM Staffing Anesthesiologist: Leilani Able Performed by: anesthesiologist  Preanesthetic Checklist Completed: patient identified, surgical consent, pre-op evaluation, timeout performed, IV checked, risks and benefits discussed and monitors and equipment checked Spinal Block Patient position: sitting Prep: site prepped and draped and DuraPrep Patient monitoring: heart rate, cardiac monitor, continuous pulse ox and blood pressure Approach: midline Location: L3-4 Injection technique: single-shot Needle Needle type: Pencan  Needle gauge: 24 G Needle length: 9 cm Needle insertion depth: 5 cm Assessment Sensory level: T4

## 2015-05-08 NOTE — Addendum Note (Signed)
Addendum  created 05/08/15 1453 by Renford Dills, CRNA   Modules edited: Notes Section   Notes Section:  File: [id number]

## 2015-05-08 NOTE — Anesthesia Postprocedure Evaluation (Signed)
  Anesthesia Post-op Note  Patient: Teresa Roberson  Procedure(s) Performed: Procedure(s): CESAREAN SECTION (N/A)  Patient Location: Mother/Baby  Anesthesia Type:Spinal  Level of Consciousness: awake  Airway and Oxygen Therapy: Patient Spontanous Breathing  Post-op Pain: mild  Post-op Assessment: Patient's Cardiovascular Status Stable and Respiratory Function Stable LLE Motor Response: Purposeful movement LLE Sensation: Decreased RLE Motor Response: Purposeful movement RLE Sensation: Decreased      Post-op Vital Signs: stable  Last Vitals:  Filed Vitals:   05/08/15 1245  BP: 119/68  Pulse: 67  Temp: 36.9 C  Resp: 20    Complications: No apparent anesthesia complications

## 2015-05-09 ENCOUNTER — Encounter (HOSPITAL_COMMUNITY): Admission: AD | Payer: Self-pay | Source: Ambulatory Visit

## 2015-05-09 ENCOUNTER — Encounter (HOSPITAL_COMMUNITY): Payer: Self-pay | Admitting: Obstetrics and Gynecology

## 2015-05-09 ENCOUNTER — Inpatient Hospital Stay (HOSPITAL_COMMUNITY)
Admission: AD | Admit: 2015-05-09 | Payer: BC Managed Care – PPO | Source: Ambulatory Visit | Admitting: Obstetrics and Gynecology

## 2015-05-09 LAB — CBC
HEMATOCRIT: 29.8 % — AB (ref 36.0–46.0)
Hemoglobin: 10 g/dL — ABNORMAL LOW (ref 12.0–15.0)
MCH: 29.6 pg (ref 26.0–34.0)
MCHC: 33.6 g/dL (ref 30.0–36.0)
MCV: 88.2 fL (ref 78.0–100.0)
Platelets: 139 10*3/uL — ABNORMAL LOW (ref 150–400)
RBC: 3.38 MIL/uL — ABNORMAL LOW (ref 3.87–5.11)
RDW: 14.1 % (ref 11.5–15.5)
WBC: 10.6 10*3/uL — ABNORMAL HIGH (ref 4.0–10.5)

## 2015-05-09 LAB — RPR: RPR: NONREACTIVE

## 2015-05-09 LAB — BIRTH TISSUE RECOVERY COLLECTION (PLACENTA DONATION)

## 2015-05-09 SURGERY — Surgical Case
Anesthesia: Regional

## 2015-05-09 NOTE — Lactation Note (Signed)
This note was copied from the chart of Teresa Roberson. Lactation Consultation Note  P1.  hand expression taught to Mom.  Baby has recessed chin. Assisted w/ football hold and breast massage. Baby tucks bottom lip in.  Taught mother how to flange bottom lip. Mom encouraged to feed baby 8-12 times/24 hours and with feeding cues.  Mom made aware of O/P services, breastfeeding support groups, community resources, and our phone # for post-discharge questions.        Patient Name: Teresa Roberson [id number] Date: 05/09/2015 Reason for consult: Initial assessment   Maternal Data    Feeding Feeding Type: Breast Fed  LATCH Score/Interventions Latch: Grasps breast easily, tongue down, lips flanged, rhythmical sucking. Intervention(s): Adjust position;Assist with latch;Breast massage  Audible Swallowing: A few with stimulation  Type of Nipple: Everted at rest and after stimulation  Comfort (Breast/Nipple): Filling, red/small blisters or bruises, mild/mod discomfort  Problem noted: Mild/Moderate discomfort Interventions (Mild/moderate discomfort): Hand expression;Comfort gels  Hold (Positioning): Assistance needed to correctly position infant at breast and maintain latch.  LATCH Score: 7  Lactation Tools Discussed/Used     Consult Status Consult Status: Follow-up Date: 05/10/15 Follow-up type: In-patient    Dahlia Byes [facility] 05/09/2015, 12:05 PM

## 2015-05-09 NOTE — Progress Notes (Signed)
Subjective: Postpartum Day 1: Cesarean Delivery Patient reports tolerating PO and + flatus.    Objective: Vital signs in last 24 hours: Temp:  [98 F (36.7 C)-98.7 F (37.1 C)] 98 F (36.7 C) (09/19 0418) Pulse Rate:  [56-72] 56 (09/19 0418) Resp:  [18-21] 18 (09/19 0418) BP: (97-119)/(53-72) 98/59 mmHg (09/19 0418) SpO2:  [95 %-100 %] 100 % (09/19 0418)  Physical Exam:  General: alert, cooperative, appears stated age and no distress Lochia: appropriate Uterine Fundus: firm Incision: healing well DVT Evaluation: No evidence of DVT seen on physical exam.   Recent Labs  05/08/15 0530 05/09/15 0525  HGB 12.5 10.0*  HCT 37.2 29.8*    Assessment/Plan: Status post Cesarean section. Doing well postoperatively.  Continue current care.  LOWE,DAVID C 05/09/2015, 9:23 AM

## 2015-05-10 NOTE — Discharge Summary (Signed)
Obstetric Discharge Summary Reason for Admission: onset of labor and cesarean section Prenatal Procedures: ultrasound Intrapartum Procedures: cesarean: low cervical, transverse Postpartum Procedures: none Complications-Operative and Postpartum: none HEMOGLOBIN  Date Value Ref Range Status  05/09/2015 10.0* 12.0 - 15.0 g/dL Final   HCT  Date Value Ref Range Status  05/09/2015 29.8* 36.0 - 46.0 % Final    Physical Exam:  General: alert Lochia: appropriate Uterine Fundus: firm Incision: healing well DVT Evaluation: No evidence of DVT seen on physical exam.  Discharge Diagnoses: Term Pregnancy-delivered  Discharge Information: Date: 05/10/2015 Activity: pelvic rest Diet: routine Medications: PNV and Ibuprofen Condition: stable Instructions: refer to practice specific booklet Discharge to: home   Newborn Data: Live born female  Birth Weight: 9 lb 5.4 oz (4235 g) APGAR: ,   Home with mother.  MCCOMB,JOHN S 05/10/2015, 10:02 AM

## 2017-08-19 LAB — OB RESULTS CONSOLE ABO/RH: RH Type: POSITIVE

## 2017-08-19 LAB — OB RESULTS CONSOLE HIV ANTIBODY (ROUTINE TESTING): HIV: NONREACTIVE

## 2017-08-19 LAB — OB RESULTS CONSOLE RUBELLA ANTIBODY, IGM: RUBELLA: IMMUNE

## 2017-08-19 LAB — OB RESULTS CONSOLE GC/CHLAMYDIA
Chlamydia: NEGATIVE
Gonorrhea: NEGATIVE

## 2017-08-19 LAB — OB RESULTS CONSOLE RPR: RPR: NONREACTIVE

## 2017-08-19 LAB — OB RESULTS CONSOLE HEPATITIS B SURFACE ANTIGEN: Hepatitis B Surface Ag: NEGATIVE

## 2018-03-12 ENCOUNTER — Encounter (HOSPITAL_COMMUNITY): Payer: Self-pay | Admitting: *Deleted

## 2018-03-26 ENCOUNTER — Encounter (HOSPITAL_COMMUNITY)
Admission: RE | Admit: 2018-03-26 | Discharge: 2018-03-26 | Disposition: A | Discharge status: Home / Self Care | Payer: BC Managed Care – PPO | Source: Ambulatory Visit | Attending: Obstetrics and Gynecology | Admitting: Obstetrics and Gynecology

## 2018-03-26 LAB — CBC
HCT: 37.4 % (ref 36.0–46.0)
Hemoglobin: 12.7 g/dL (ref 12.0–15.0)
MCH: 29.7 pg (ref 26.0–34.0)
MCHC: 34 g/dL (ref 30.0–36.0)
MCV: 87.4 fL (ref 78.0–100.0)
PLATELETS: 168 10*3/uL (ref 150–400)
RBC: 4.28 MIL/uL (ref 3.87–5.11)
RDW: 13.7 % (ref 11.5–15.5)
WBC: 6.6 10*3/uL (ref 4.0–10.5)

## 2018-03-26 LAB — TYPE AND SCREEN
ABO/RH(D): A POS
Antibody Screen: NEGATIVE

## 2018-03-26 NOTE — Anesthesia Preprocedure Evaluation (Signed)
Anesthesia Evaluation  Patient identified by MRN, date of birth, ID band Patient awake    Reviewed: Allergy & Precautions, H&P , NPO status , Patient's Chart, lab work & pertinent test results  Airway Mallampati: I  TM Distance: >3 FB Neck ROM: full    Dental no notable dental hx.    Pulmonary neg pulmonary ROS,    Pulmonary exam normal        Cardiovascular negative cardio ROS Normal cardiovascular exam     Neuro/Psych negative neurological ROS  negative psych ROS   GI/Hepatic negative GI ROS, Neg liver ROS,   Endo/Other  negative endocrine ROS  Renal/GU negative Renal ROS     Musculoskeletal   Abdominal (+) + obese,   Peds  Hematology negative hematology ROS (+)   Anesthesia Other Findings   Reproductive/Obstetrics (+) Pregnancy                             Anesthesia Physical  Anesthesia Plan  ASA: II  Anesthesia Plan: Spinal   Post-op Pain Management:    Induction:   PONV Risk Score and Plan: 2 and Ondansetron and Treatment may vary due to age or medical condition  Airway Management Planned: Nasal Cannula  Additional Equipment:   Intra-op Plan:   Post-operative Plan:   Informed Consent: I have reviewed the patients History and Physical, chart, labs and discussed the procedure including the risks, benefits and alternatives for the proposed anesthesia with the patient or authorized representative who has indicated his/her understanding and acceptance.     Plan Discussed with: CRNA, Surgeon and Anesthesiologist  Anesthesia Plan Comments:         Anesthesia Quick Evaluation

## 2018-03-26 NOTE — H&P (Signed)
Teresa Roberson is a 37 y.o. female presenting for repeat C/S with BTL. Pregnancy complicated by AMA with normal NIPT. OB History    Gravida  2   Para  1   Term  1   Preterm      AB      Living  0     SAB      TAB      Ectopic      Multiple  0   Live Births             No past medical history on file. Past Surgical History:  Procedure Laterality Date  . APPENDECTOMY    . CESAREAN SECTION N/A 05/08/2015   Procedure: CESAREAN SECTION;  Surgeon: Marcelle OverlieMichelle Grewal, MD;  Location: [facility];  Service: Obstetrics;  Laterality: N/A;   Family History: family history includes Hypertension in her father; Lung cancer in her paternal grandfather; Thyroid disease in her father. Social History:  reports that she has never smoked. She has never used smokeless tobacco. She reports that she does not drink alcohol or use drugs.     Maternal Diabetes: No Genetic Screening: Normal Maternal Ultrasounds/Referrals: Normal Fetal Ultrasounds or other Referrals:  None Maternal Substance Abuse:  No Significant Maternal Medications:  None Significant Maternal Lab Results:  None Other Comments:  None  Review of Systems  Eyes: Negative for blurred vision.  Gastrointestinal: Negative for abdominal pain.  Neurological: Negative for headaches.   Maternal Medical History:  Fetal activity: Perceived fetal activity is normal.        Last menstrual period 06/22/2017, unknown if currently breastfeeding. Exam Physical Exam  Cardiovascular: Normal rate and regular rhythm.  Respiratory: Effort normal and breath sounds normal.  GI: Soft. There is no tenderness.  Neurological: She has normal reflexes.    Prenatal labs: ABO, Rh: --/--/A POS (08/07 1008) Antibody: NEG (08/07 1008) Rubella: Immune (12/31 0000) RPR: Nonreactive (12/31 0000)  HBsAg: Negative (12/31 0000)  HIV: Non-reactive (12/31 0000)  GBS:   negative  Assessment/Plan: 37 yo G2P1 @ term who desires repeat cesarean section  and bilateral tubal ligation Risks reviewed including infection, organ damage, bleeding/transfusion-HIV/Hep, DVT/PE, pneumonia, wound breakdown and return to OR.   Teresa LocusJames E Elam Ellis II 03/26/2018, 3:01 PM

## 2018-03-26 NOTE — Patient Instructions (Signed)
Jillene Bucksmanda S Belmonte  03/26/2018   Your procedure is scheduled on:  03/27/2018  Enter through the Main Entrance of [facility] at 0530 AM.  Pick up the phone at the desk and dial [phone]  Call this number if you have problems the morning of surgery:[phone]  Remember:   Do not eat food:(After Midnight) Desps de medianoche.  Do not drink clear liquids: (After Midnight) Desps de medianoche.  Take these medicines the morning of surgery with A SIP OF WATER: none   Do not wear jewelry, make-up or nail polish.  Do not wear lotions, powders, or perfumes. Do not wear deodorant.  Do not shave 48 hours prior to surgery.  Do not bring valuables to the hospital.  [facility] is not   responsible for any belongings or valuables brought to the hospital.  Contacts, dentures or bridgework may not be worn into surgery.  Leave suitcase in the car. After surgery it may be brought to your room.  For patients admitted to the hospital, checkout time is 11:00 AM the day of              discharge.    N/A   Please read over the following fact sheets that you were given:   Surgical Site Infection Prevention

## 2018-03-27 ENCOUNTER — Inpatient Hospital Stay (HOSPITAL_COMMUNITY): Payer: BC Managed Care – PPO | Admitting: Anesthesiology

## 2018-03-27 ENCOUNTER — Inpatient Hospital Stay (HOSPITAL_COMMUNITY)
Admission: RE | Admit: 2018-03-27 | Discharge: 2018-03-29 | DRG: 785 | Disposition: A | Discharge status: Home / Self Care | Payer: BC Managed Care – PPO | Attending: Obstetrics and Gynecology | Admitting: Obstetrics and Gynecology

## 2018-03-27 ENCOUNTER — Encounter (HOSPITAL_COMMUNITY)
Admission: RE | Disposition: A | Discharge status: Home / Self Care | Payer: Self-pay | Source: Home / Self Care | Attending: Obstetrics and Gynecology

## 2018-03-27 ENCOUNTER — Other Ambulatory Visit: Payer: Self-pay

## 2018-03-27 ENCOUNTER — Encounter (HOSPITAL_COMMUNITY): Payer: Self-pay | Admitting: *Deleted

## 2018-03-27 DIAGNOSIS — O34211 Maternal care for low transverse scar from previous cesarean delivery: Principal | ICD-10-CM | POA: Diagnosis present

## 2018-03-27 DIAGNOSIS — Z302 Encounter for sterilization: Secondary | ICD-10-CM

## 2018-03-27 DIAGNOSIS — Z3A40 40 weeks gestation of pregnancy: Secondary | ICD-10-CM | POA: Diagnosis not present

## 2018-03-27 DIAGNOSIS — Z98891 History of uterine scar from previous surgery: Secondary | ICD-10-CM

## 2018-03-27 LAB — RPR: RPR: NONREACTIVE

## 2018-03-27 SURGERY — Surgical Case
Anesthesia: Spinal | Site: Abdomen | Laterality: Bilateral | Wound class: Clean Contaminated

## 2018-03-27 MED ORDER — ACETAMINOPHEN 160 MG/5ML PO SOLN: 325.0000 mg | ORAL | Status: DC | PRN

## 2018-03-27 MED ORDER — ZOLPIDEM TARTRATE 5 MG PO TABS: 5.0000 mg | ORAL_TABLET | Freq: Every evening | ORAL | Status: DC | PRN

## 2018-03-27 MED ORDER — DIBUCAINE 1 % RE OINT: 1.0000 "application " | TOPICAL_OINTMENT | RECTAL | Status: DC | PRN

## 2018-03-27 MED ORDER — BUPIVACAINE LIPOSOME 1.3 % IJ SUSP
INTRAMUSCULAR | Status: DC | PRN
  Administered 2018-03-27: 20 mL

## 2018-03-27 MED ORDER — MEPERIDINE HCL 25 MG/ML IJ SOLN: 6.2500 mg | INTRAMUSCULAR | Status: DC | PRN

## 2018-03-27 MED ORDER — PHENYLEPHRINE 40 MCG/ML (10ML) SYRINGE FOR IV PUSH (FOR BLOOD PRESSURE SUPPORT)
PREFILLED_SYRINGE | INTRAVENOUS | Status: DC | PRN
  Administered 2018-03-27: 120 ug via INTRAVENOUS
  Administered 2018-03-27: 40 ug via INTRAVENOUS
  Administered 2018-03-27: 120 ug via INTRAVENOUS

## 2018-03-27 MED ORDER — LACTATED RINGERS IV SOLN
INTRAVENOUS | Status: DC | PRN
  Administered 2018-03-27 (×2): via INTRAVENOUS

## 2018-03-27 MED ORDER — LACTATED RINGERS IV SOLN
INTRAVENOUS | Status: DC
  Administered 2018-03-27: 19:00:00 via INTRAVENOUS

## 2018-03-27 MED ORDER — SIMETHICONE 80 MG PO CHEW: 80.0000 mg | CHEWABLE_TABLET | ORAL | Status: DC | PRN

## 2018-03-27 MED ORDER — ACETAMINOPHEN 325 MG PO TABS: 325.0000 mg | ORAL_TABLET | ORAL | Status: DC | PRN

## 2018-03-27 MED ORDER — BUPIVACAINE HCL (PF) 0.25 % IJ SOLN
INTRAMUSCULAR | Status: DC | PRN
  Administered 2018-03-27: 20 mL

## 2018-03-27 MED ORDER — FENTANYL CITRATE (PF) 100 MCG/2ML IJ SOLN
INTRAMUSCULAR | Status: AC
  Filled 2018-03-27: qty 2

## 2018-03-27 MED ORDER — ONDANSETRON HCL 4 MG/2ML IJ SOLN
INTRAMUSCULAR | Status: AC
  Filled 2018-03-27: qty 2

## 2018-03-27 MED ORDER — CEFAZOLIN SODIUM-DEXTROSE 2-4 GM/100ML-% IV SOLN
2.0000 g | INTRAVENOUS | Status: AC
  Administered 2018-03-27: 2 g via INTRAVENOUS
  Filled 2018-03-27: qty 100

## 2018-03-27 MED ORDER — FENTANYL CITRATE (PF) 100 MCG/2ML IJ SOLN
INTRAMUSCULAR | Status: DC | PRN
  Administered 2018-03-27: 25 ug via INTRATHECAL

## 2018-03-27 MED ORDER — SODIUM CHLORIDE 0.9 % IR SOLN
Status: DC | PRN
  Administered 2018-03-27: 1

## 2018-03-27 MED ORDER — FENTANYL CITRATE (PF) 100 MCG/2ML IJ SOLN: INTRAMUSCULAR | Status: DC | PRN

## 2018-03-27 MED ORDER — TETANUS-DIPHTH-ACELL PERTUSSIS 5-2.5-18.5 LF-MCG/0.5 IM SUSP: 0.5000 mL | Freq: Once | INTRAMUSCULAR | Status: DC

## 2018-03-27 MED ORDER — OXYCODONE HCL 5 MG PO TABS: 10.0000 mg | ORAL_TABLET | ORAL | Status: DC | PRN

## 2018-03-27 MED ORDER — BUPIVACAINE LIPOSOME 1.3 % IJ SUSP
20.0000 mL | Freq: Once | INTRAMUSCULAR | Status: DC
  Filled 2018-03-27: qty 20

## 2018-03-27 MED ORDER — OXYTOCIN 10 UNIT/ML IJ SOLN
INTRAVENOUS | Status: DC | PRN
  Administered 2018-03-27: 40 [IU] via INTRAVENOUS

## 2018-03-27 MED ORDER — BUPIVACAINE IN DEXTROSE 0.75-8.25 % IT SOLN
INTRATHECAL | Status: DC | PRN
  Administered 2018-03-27: 1.7 mL via INTRATHECAL

## 2018-03-27 MED ORDER — OXYTOCIN 40 UNITS IN LACTATED RINGERS INFUSION - SIMPLE MED: 2.5000 [IU]/h | INTRAVENOUS | Status: AC

## 2018-03-27 MED ORDER — SIMETHICONE 80 MG PO CHEW
80.0000 mg | CHEWABLE_TABLET | Freq: Three times a day (TID) | ORAL | Status: DC
  Administered 2018-03-27 – 2018-03-29 (×5): 80 mg via ORAL
  Filled 2018-03-27 (×5): qty 1

## 2018-03-27 MED ORDER — GLYCOPYRROLATE 0.2 MG/ML IJ SOLN
INTRAMUSCULAR | Status: DC | PRN
  Administered 2018-03-27 (×2): 0.2 mg via INTRAVENOUS

## 2018-03-27 MED ORDER — MENTHOL 3 MG MT LOZG: 1.0000 | LOZENGE | OROMUCOSAL | Status: DC | PRN

## 2018-03-27 MED ORDER — FENTANYL CITRATE (PF) 100 MCG/2ML IJ SOLN: 25.0000 ug | INTRAMUSCULAR | Status: DC | PRN

## 2018-03-27 MED ORDER — PHENYLEPHRINE 8 MG IN D5W 100 ML (0.08MG/ML) PREMIX OPTIME
INJECTION | INTRAVENOUS | Status: DC | PRN
  Administered 2018-03-27: 60 ug/min via INTRAVENOUS

## 2018-03-27 MED ORDER — GLYCOPYRROLATE 0.2 MG/ML IJ SOLN
INTRAMUSCULAR | Status: AC
  Filled 2018-03-27: qty 2

## 2018-03-27 MED ORDER — ONDANSETRON HCL 4 MG/2ML IJ SOLN: 4.0000 mg | Freq: Once | INTRAMUSCULAR | Status: DC | PRN

## 2018-03-27 MED ORDER — SOD CITRATE-CITRIC ACID 500-334 MG/5ML PO SOLN
30.0000 mL | Freq: Once | ORAL | Status: AC
  Administered 2018-03-27: 30 mL via ORAL
  Filled 2018-03-27: qty 15

## 2018-03-27 MED ORDER — PHENYLEPHRINE 40 MCG/ML (10ML) SYRINGE FOR IV PUSH (FOR BLOOD PRESSURE SUPPORT)
PREFILLED_SYRINGE | INTRAVENOUS | Status: AC
  Filled 2018-03-27: qty 10

## 2018-03-27 MED ORDER — OXYCODONE HCL 5 MG PO TABS
5.0000 mg | ORAL_TABLET | ORAL | Status: DC | PRN
  Administered 2018-03-28 (×2): 5 mg via ORAL
  Filled 2018-03-27 (×2): qty 1

## 2018-03-27 MED ORDER — IBUPROFEN 600 MG PO TABS
600.0000 mg | ORAL_TABLET | Freq: Four times a day (QID) | ORAL | Status: DC
  Administered 2018-03-27 – 2018-03-29 (×8): 600 mg via ORAL
  Filled 2018-03-27 (×8): qty 1

## 2018-03-27 MED ORDER — SENNOSIDES-DOCUSATE SODIUM 8.6-50 MG PO TABS
2.0000 | ORAL_TABLET | ORAL | Status: DC
  Administered 2018-03-28 (×2): 2 via ORAL
  Filled 2018-03-27 (×2): qty 2

## 2018-03-27 MED ORDER — COCONUT OIL OIL: 1.0000 "application " | TOPICAL_OIL | Status: DC | PRN

## 2018-03-27 MED ORDER — DIPHENHYDRAMINE HCL 25 MG PO CAPS: 25.0000 mg | ORAL_CAPSULE | Freq: Four times a day (QID) | ORAL | Status: DC | PRN

## 2018-03-27 MED ORDER — PRENATAL MULTIVITAMIN CH
1.0000 | ORAL_TABLET | Freq: Every day | ORAL | Status: DC
  Administered 2018-03-28: 1 via ORAL
  Filled 2018-03-27: qty 1

## 2018-03-27 MED ORDER — OXYCODONE HCL 5 MG/5ML PO SOLN: 5.0000 mg | Freq: Once | ORAL | Status: DC | PRN

## 2018-03-27 MED ORDER — LACTATED RINGERS IV SOLN: 100.0000 mL/h | INTRAVENOUS | Status: DC

## 2018-03-27 MED ORDER — OXYTOCIN 10 UNIT/ML IJ SOLN
INTRAMUSCULAR | Status: AC
  Filled 2018-03-27: qty 4

## 2018-03-27 MED ORDER — BUPIVACAINE HCL (PF) 0.25 % IJ SOLN
INTRAMUSCULAR | Status: AC
  Filled 2018-03-27: qty 30

## 2018-03-27 MED ORDER — SOD CITRATE-CITRIC ACID 500-334 MG/5ML PO SOLN: 30.0000 mL | ORAL | Status: DC

## 2018-03-27 MED ORDER — ONDANSETRON HCL 4 MG/2ML IJ SOLN
INTRAMUSCULAR | Status: DC | PRN
  Administered 2018-03-27: 4 mg via INTRAVENOUS

## 2018-03-27 MED ORDER — LACTATED RINGERS IV SOLN
INTRAVENOUS | Status: DC
  Administered 2018-03-27: 08:00:00 via INTRAVENOUS

## 2018-03-27 MED ORDER — OXYCODONE HCL 5 MG PO TABS: 5.0000 mg | ORAL_TABLET | Freq: Once | ORAL | Status: DC | PRN

## 2018-03-27 MED ORDER — ACETAMINOPHEN 325 MG PO TABS
650.0000 mg | ORAL_TABLET | ORAL | Status: DC | PRN
  Administered 2018-03-28 (×2): 650 mg via ORAL
  Filled 2018-03-27 (×2): qty 2

## 2018-03-27 MED ORDER — PHENYLEPHRINE 8 MG IN D5W 100 ML (0.08MG/ML) PREMIX OPTIME
INJECTION | INTRAVENOUS | Status: AC
  Filled 2018-03-27: qty 100

## 2018-03-27 MED ORDER — WITCH HAZEL-GLYCERIN EX PADS: 1.0000 "application " | MEDICATED_PAD | CUTANEOUS | Status: DC | PRN

## 2018-03-27 MED ORDER — SIMETHICONE 80 MG PO CHEW
80.0000 mg | CHEWABLE_TABLET | ORAL | Status: DC
  Administered 2018-03-28 (×2): 80 mg via ORAL
  Filled 2018-03-27 (×2): qty 1

## 2018-03-27 SURGICAL SUPPLY — 38 items
BENZOIN TINCTURE PRP APPL 2/3 (GAUZE/BANDAGES/DRESSINGS) ×3 IMPLANT
CLAMP CORD UMBIL (MISCELLANEOUS) IMPLANT
CLOSURE STERI STRIP 1/2 X4 (GAUZE/BANDAGES/DRESSINGS) ×3 IMPLANT
CLOSURE WOUND 1/2 X4 (GAUZE/BANDAGES/DRESSINGS)
CLOTH BEACON ORANGE TIMEOUT ST (SAFETY) ×3 IMPLANT
DERMABOND ADVANCED (GAUZE/BANDAGES/DRESSINGS)
DERMABOND ADVANCED .7 DNX12 (GAUZE/BANDAGES/DRESSINGS) IMPLANT
DRSG OPSITE POSTOP 4X10 (GAUZE/BANDAGES/DRESSINGS) ×3 IMPLANT
ELECT REM PT RETURN 9FT ADLT (ELECTROSURGICAL) ×3
ELECTRODE REM PT RTRN 9FT ADLT (ELECTROSURGICAL) ×1 IMPLANT
EXTRACTOR VACUUM KIWI (MISCELLANEOUS) ×3 IMPLANT
EXTRACTOR VACUUM M CUP 4 TUBE (SUCTIONS) IMPLANT
EXTRACTOR VACUUM M CUP 4' TUBE (SUCTIONS)
GLOVE BIO SURGEON STRL SZ7.5 (GLOVE) ×3 IMPLANT
GLOVE BIOGEL PI IND STRL 7.0 (GLOVE) ×1 IMPLANT
GLOVE BIOGEL PI INDICATOR 7.0 (GLOVE) ×2
GOWN STRL REUS W/TWL LRG LVL3 (GOWN DISPOSABLE) ×6 IMPLANT
KIT ABG SYR 3ML LUER SLIP (SYRINGE) ×3 IMPLANT
NEEDLE HYPO 22GX1.5 SAFETY (NEEDLE) ×6 IMPLANT
NEEDLE HYPO 25X5/8 SAFETYGLIDE (NEEDLE) ×3 IMPLANT
NS IRRIG 1000ML POUR BTL (IV SOLUTION) ×3 IMPLANT
PACK C SECTION WH (CUSTOM PROCEDURE TRAY) ×3 IMPLANT
PAD ABD 8X10 STRL (GAUZE/BANDAGES/DRESSINGS) ×6 IMPLANT
PAD OB MATERNITY 4.3X12.25 (PERSONAL CARE ITEMS) ×3 IMPLANT
PENCIL SMOKE EVAC W/HOLSTER (ELECTROSURGICAL) ×3 IMPLANT
STRIP CLOSURE SKIN 1/2X4 (GAUZE/BANDAGES/DRESSINGS) IMPLANT
SUT MNCRL 0 VIOLET CTX 36 (SUTURE) ×4 IMPLANT
SUT MONOCRYL 0 CTX 36 (SUTURE) ×8
SUT PDS AB 0 CTX 60 (SUTURE) ×3 IMPLANT
SUT PLAIN 0 NONE (SUTURE) IMPLANT
SUT PLAIN 2 0 (SUTURE) ×2
SUT PLAIN 2 0 XLH (SUTURE) ×3 IMPLANT
SUT PLAIN ABS 2-0 CT1 27XMFL (SUTURE) ×1 IMPLANT
SUT VIC AB 4-0 KS 27 (SUTURE) ×3 IMPLANT
SYR CONTROL 10ML LL (SYRINGE) ×3 IMPLANT
TOWEL OR 17X24 6PK STRL BLUE (TOWEL DISPOSABLE) ×3 IMPLANT
TRAY FOLEY W/BAG SLVR 14FR LF (SET/KITS/TRAYS/PACK) ×3 IMPLANT
WATER STERILE IRR 1000ML POUR (IV SOLUTION) ×3 IMPLANT

## 2018-03-27 NOTE — Transfer of Care (Signed)
Immediate Anesthesia Transfer of Care Note  Patient: Teresa Roberson  Procedure(s) Performed: REPEAT CESAREAN SECTION WITH BILATERAL TUBAL LIGATION (Bilateral Abdomen)  Patient Location: PACU  Anesthesia Type:Spinal  Level of Consciousness: awake, alert , oriented and patient cooperative  Airway & Oxygen Therapy: Patient Spontanous Breathing  Post-op Assessment: Report given to RN and Post -op Vital signs reviewed and stable  Post vital signs: Reviewed and stable  Last Vitals:  Vitals Value Taken Time  BP    Temp    Pulse 94 03/27/2018  8:46 AM  Resp 16 03/27/2018  8:46 AM  SpO2 98 % 03/27/2018  8:46 AM  Vitals shown include unvalidated device data.  Last Pain:  Vitals:   03/27/18 0605  TempSrc: Oral  PainSc: 0-No pain         Complications: No apparent anesthesia complications

## 2018-03-27 NOTE — Brief Op Note (Signed)
03/27/2018  8:24 AM  PATIENT:  Teresa Roberson  37 y.o. female  PRE-OPERATIVE DIAGNOSIS:  PREVIOUS x 1, DESIRES STERILITY  POST-OPERATIVE DIAGNOSIS:  * No post-op diagnosis entered *  PROCEDURE:  Procedure(s) with comments: REPEAT CESAREAN SECTION WITH BILATERAL TUBAL LIGATION (Bilateral) - REPEAT EDC 03/29/18 NKDA Tracey RNFA  SURGEON:  Surgeon(s) and Role:    * Harold Hedgeomblin, Teneil Shiller, MD - Primary  PHYSICIAN ASSISTANT:   ASSISTANTS: Genice Rougeracey Tucker RNFA  ANESTHESIA:   spinal  EBL:  500 mL   BLOOD ADMINISTERED:none  DRAINS: Urinary Catheter (Foley)   LOCAL MEDICATIONS USED:  BUPIVICAINE , Exparel mixture 50:50, total of 40 ml injected  SPECIMEN:    DISPOSITION OF SPECIMEN:  N/A  COUNTS:  YES  TOURNIQUET:  * No tourniquets in log *  DICTATION: .Other Dictation: Dictation Number [id number]  PLAN OF CARE: Admit to inpatient   PATIENT DISPOSITION:  PACU - hemodynamically stable.   Delay start of Pharmacological VTE agent (>24hrs) due to surgical blood loss or risk of bleeding: not applicable

## 2018-03-27 NOTE — Progress Notes (Signed)
No changes to H&P per patient history  Reviewed with patient procedure-cesarean section and BTL Patient states she understands and agrees

## 2018-03-27 NOTE — Anesthesia Postprocedure Evaluation (Signed)
Anesthesia Post Note  Patient: Teresa Roberson  Procedure(s) Performed: REPEAT CESAREAN SECTION WITH BILATERAL TUBAL LIGATION (Bilateral Abdomen)     Patient location during evaluation: Mother Baby Anesthesia Type: Spinal Level of consciousness: awake and alert and oriented Pain management: satisfactory to patient Vital Signs Assessment: post-procedure vital signs reviewed and stable Respiratory status: spontaneous breathing and nonlabored ventilation Cardiovascular status: stable Postop Assessment: no headache, no backache, patient able to bend at knees, no signs of nausea or vomiting, adequate PO intake and no apparent nausea or vomiting Anesthetic complications: no    Last Vitals:  Vitals:   03/27/18 1115 03/27/18 1230  BP: (!) 95/58 110/67  Pulse: 66 69  Resp: 18 16  Temp: 36.8 C 36.8 C  SpO2: 98% 99%    Last Pain:  Vitals:   03/27/18 1230  TempSrc: Oral  PainSc: 4    Pain Goal:                 Mirissa Lopresti

## 2018-03-27 NOTE — Op Note (Signed)
NAME: Teresa Roberson, Special S. MEDICAL RECORD [id number] ACCOUNT [id number] DATE OF BIRTH:[birth year] FACILITY: [facility] LOCATION: [facility] PHYSICIAN:Emiley Digiacomo Jamal CollinE. Cindee Mclester II, MD  OPERATIVE REPORT  DATE OF PROCEDURE:  03/27/2018  PREOPERATIVE DIAGNOSES: 1.  Desires repeat cesarean section. 2.  Desires permanent sterilization.  POSTOPERATIVE DIAGNOSES: 1.  Desires repeat cesarean section. 2.  Desires permanent sterilization.  SURGEON:  Harold HedgeJames Ortencia Askari MD.  ANESTHESIA:  Spinal.  ESTIMATED BLOOD LOSS:  500 mL.  SPECIMENS:  None.  FINDINGS:  Viable female infant.  Apgars, arterial cord pH, birth weight pending.  PROCEDURE: 1.  Repeat low transverse cesarean section. 2.  Bilateral tubal ligation.  INDICATION AND CONSENT: This patient is a 37 year old G2 P1 at 39-5/7 weeks who desires repeat cesarean section.  She also desires permanent sterilization.  Potential risks and complications were reviewed preoperatively including but not limited to infection, organ damage,  bleeding requiring transfusion of blood products with HIV and hepatitis acquisition, DVT, PE, pneumonia return to the operating room, wound breakdown.  Permanence of tubal ligation, alternative methods of contraception, failure rate, and increased  ectopic risk also reviewed.  The patient states she understands and agrees and consent was signed on the chart.  DESCRIPTION OF PROCEDURE:  The patient was taken to the operating room where she was identified.  Spinal anesthetic was placed and she was placed in the dorsal supine position with a 15 degree left lateral wedge.  She was prepped vaginally with Betadine.   Foley catheter was placed and prepped abdominally with ChloraPrep.  After 3 minute drying time, she was draped in sterile fashion.  Timeout was undertaken.  After testing for adequate spinal anesthesia, skin was entered through the Pfannenstiel scar  and dissection was carried out in layers to the peritoneum which was extended  superiorly and inferiorly.  Vesicouterine peritoneum was taken down cephalad laterally and the bladder flap developed.  Bladder blade was placed.  Uterus was incised in a low  transverse manner.  Uterine cavity was entered bluntly with a hemostat for clear fluid.  Incision was extended with fingers.  Vertex was delivered with the aid of a gentle Kiwi vacuum extractor to elevate the head through the incision with no pop-offs.   A loose nuchal cord x1 was reduced.  The baby was then delivered and the oronasopharynx were suctioned.  Then, after 1 minute observation time, the cord was clamped and cut and the baby was handed to waiting pediatrics team.  Placenta was manually  delivered.  Uterine cavity was clean.  Uterus was closed in 2 running locking imbricating layers of 0 Monocryl, which achieved good hemostasis.  The left fallopian tube was identified from cornu to fimbria and grasped in its mid ampullary portion with a  Babcock clamp.  A knuckle of tube was then doubly ligated with 2 free ties of plain suture and the intervening knuckle was sharply resected.  Cautery was used to assure complete hemostasis.  Similar procedure was carried out on the right side.  Ovaries  appeared normal.  Lavage was carried out.  Anterior peritoneum was closed in a running fashion with 0 Monocryl suture, which was also used to reapproximate the pyramidalis muscle in the midline.  Anterior rectus fascia was closed in a running fashion  with a 0 looped PDS.  The 50:50 mixture of Exparel and 0.25% bupivacaine was injected subfascially and subcutaneously.  A total of 40 mL of the solution was injected.  Adipose layer was closed with interrupted plain suture and the skin was closed  in a  subcuticular fashion with the 4-0 Vicryl on a Keith needle.  Benzoin, Steri-Strips, dressing, and pressure dressing applied.  All counts were correct and the patient was taken to the recovery room in stable condition.  TN/NUANCE  D:03/27/2018  T:03/27/2018 JOB:[id number]

## 2018-03-27 NOTE — Anesthesia Postprocedure Evaluation (Signed)
Anesthesia Post Note  Patient: Teresa Roberson  Procedure(s) Performed: REPEAT CESAREAN SECTION WITH BILATERAL TUBAL LIGATION (Bilateral Abdomen)     Patient location during evaluation: PACU Anesthesia Type: Spinal Level of consciousness: oriented and awake and alert Pain management: pain level controlled Vital Signs Assessment: post-procedure vital signs reviewed and stable Respiratory status: spontaneous breathing, respiratory function stable and patient connected to nasal cannula oxygen Cardiovascular status: blood pressure returned to baseline and stable Postop Assessment: no headache, no backache and no apparent nausea or vomiting Anesthetic complications: no    Last Vitals:  Vitals:   03/27/18 1000 03/27/18 1015  BP: (!) 106/48 98/66  Pulse: 66 74  Resp: (!) 22 16  Temp:  36.6 C  SpO2: 99% 100%    Last Pain:  Vitals:   03/27/18 1015  TempSrc: Oral  PainSc:    Pain Goal:                 Fayez Sturgell

## 2018-03-27 NOTE — Addendum Note (Signed)
Addendum  created 03/27/18 1327 by Shanon PayorGregory, Azriella Mattia M, CRNA   Sign clinical note

## 2018-03-27 NOTE — Anesthesia Procedure Notes (Signed)
Spinal  Patient location during procedure: OR Start time: 03/27/2018 7:27 AM End time: 03/27/2018 7:30 AM Staffing Anesthesiologist: Bethena Midgetddono, Taysean Wager, MD Preanesthetic Checklist Completed: patient identified, site marked, surgical consent, pre-op evaluation, timeout performed, IV checked, risks and benefits discussed and monitors and equipment checked Spinal Block Patient position: sitting Prep: DuraPrep Patient monitoring: heart rate, cardiac monitor, continuous pulse ox and blood pressure Approach: midline Location: L3-4 Injection technique: single-shot Needle Needle type: Sprotte  Needle gauge: 24 G Needle length: 9 cm Assessment Sensory level: T4

## 2018-03-28 LAB — CBC
HCT: 37.3 % (ref 36.0–46.0)
Hemoglobin: 12.6 g/dL (ref 12.0–15.0)
MCH: 29.6 pg (ref 26.0–34.0)
MCHC: 33.8 g/dL (ref 30.0–36.0)
MCV: 87.6 fL (ref 78.0–100.0)
PLATELETS: 177 10*3/uL (ref 150–400)
RBC: 4.26 MIL/uL (ref 3.87–5.11)
RDW: 13.8 % (ref 11.5–15.5)
WBC: 11.2 10*3/uL — ABNORMAL HIGH (ref 4.0–10.5)

## 2018-03-28 NOTE — Lactation Note (Signed)
This note was copied from a baby's chart. Lactation Consultation Note  Patient Name: Teresa Mamie Nickmanda Lattner QIONG'EToday's Date: 03/28/2018 Reason for consult: Initial assessment;Term P2, 21 hour female infant, mom C/S delivery. Mom is experience BF, BF older son for 4 months  Per mom, has medela DEBP at home. LC enter room infant was cuing in basinet. Mom breastfeed infant  using the football hold on right breast. Mom tickled  Infant's top lip w/ breast , infant opened his mouth  w/ wide gape and latched and lower jaw was extended, sucks and swallowing observed by LC, latch-9. Per mom, she has been using the football and cross-cradle position to feed baby.  Mom is knowledge about hand expression, breast massage and compressions. LC discussed I&O w/ parents. Mom plans to feed infant according huger cues and 8-12 times/24 hours including nights. Reviewed Baby & Me book's Breastfeeding Basics.  Mom made aware of O/P services, breastfeeding support groups, community resources, and our phone # for post-discharge questions.  Maternal Data Has patient been taught Hand Expression?: Yes(Per mom, by RN) Does the patient have breastfeeding experience prior to this delivery?: Yes  Feeding Feeding Type: Breast Fed Length of feed: 20 min  LATCH Score Latch: Grasps breast easily, tongue down, lips flanged, rhythmical sucking.  Audible Swallowing: Spontaneous and intermittent  Type of Nipple: Everted at rest and after stimulation  Comfort (Breast/Nipple): Soft / non-tender  Hold (Positioning): Assistance needed to correctly position infant at breast and maintain latch.  LATCH Score: 9  Interventions Interventions: Skin to skin;Assisted with latch;Breast feeding basics reviewed;Hand express;Support pillows  Lactation Tools Discussed/Used WIC Program: No   Consult Status      Teresa Roberson 03/28/2018, 5:30 AM

## 2018-03-28 NOTE — Lactation Note (Signed)
This note was copied from a baby's chart. Lactation Consultation Note  Patient Name: Boy Mamie Nickmanda Bushnell ZOXWR'UToday's Date: 03/28/2018 Reason for consult: Follow-up assessment;Term  P2 mother whose infant is now 4831 hours old.  Baby was sleeping in bassinet as I arrived.  Mother stated that he just finished feeding about 10 minutes prior to my arrival.  Mother feels like breastfeeding is going well and she has no questions/concerns at this time.  She is feeding at least 8-12 times/24 hours and with feeding cues.  She understands hand expression and is doing this before/after feedings.  Her breasts are soft and non tender and nipples are not sore.  Reminded her to rub EBM into nipples and areola for comfort.    Encouraged continued STS, breast massage and hand expression.  Informed her to call for assistance as needed.  Father in room and supportive.   Maternal Data Formula Feeding for Exclusion: No Has patient been taught Hand Expression?: Yes Does the patient have breastfeeding experience prior to this delivery?: Yes  Feeding Feeding Type: Breast Fed Length of feed: 25 min  LATCH Score                   Interventions    Lactation Tools Discussed/Used     Consult Status Consult Status: Follow-up Date: 03/29/18 Follow-up type: In-patient    Dannia Snook R Nussen Pullin 03/28/2018, 2:55 PM

## 2018-03-28 NOTE — Progress Notes (Signed)
Subjective: Postpartum Day 1: Cesarean Delivery Patient reports tolerating PO.    Objective: Vital signs in last 24 hours: Temp:  [97.4 F (36.3 C)-98.8 F (37.1 C)] 98.5 F (36.9 C) (08/09 0440) Pulse Rate:  [66-94] 77 (08/09 0440) Resp:  [13-22] 18 (08/09 0440) BP: (88-118)/(48-98) 104/68 (08/09 0440) SpO2:  [97 %-100 %] 97 % (08/09 0440)  Physical Exam:  General: alert Lochia: appropriate Uterine Fundus: firm Incision: healing well DVT Evaluation: No evidence of DVT seen on physical exam.  Recent Labs    03/26/18 1008 03/28/18 0510  HGB 12.7 12.6  HCT 37.4 37.3    Assessment/Plan: Status post Cesarean section. Doing well postoperatively.  Continue current care.  Deng Kemler Milana ObeyM Marcia Hartwell 03/28/2018, 8:37 AM

## 2018-03-29 ENCOUNTER — Ambulatory Visit: Payer: Self-pay

## 2018-03-29 MED ORDER — IBUPROFEN 600 MG PO TABS: 600.0000 mg | ORAL_TABLET | Freq: Four times a day (QID) | ORAL | 0 refills | Status: DC

## 2018-03-29 MED ORDER — OXYCODONE HCL 5 MG PO TABS: 5.0000 mg | ORAL_TABLET | ORAL | 0 refills | Status: DC | PRN

## 2018-03-29 NOTE — Discharge Summary (Signed)
Obstetric Discharge Summary Reason for Admission: cesarean section Prenatal Procedures: none Intrapartum Procedures: cesarean: low cervical, transverse Postpartum Procedures: none Complications-Operative and Postpartum: none Hemoglobin  Date Value Ref Range Status  03/28/2018 12.6 12.0 - 15.0 g/dL Final   HCT  Date Value Ref Range Status  03/28/2018 37.3 36.0 - 46.0 % Final    Physical Exam:  General: alert Lochia: appropriate Uterine Fundus: firm Incision: healing well DVT Evaluation: No evidence of DVT seen on physical exam.  Discharge Diagnoses: Term Pregnancy-delivered  Discharge Information: Date: 03/29/2018 Activity: pelvic rest Diet: routine Medications: PNV, Ibuprofen and Percocet Condition: stable Instructions: refer to practice specific booklet Discharge to: home Follow-up Information    [place] Schedule an appointment as soon as possible for a visit in 1 week(s).   Contact information: [address] [place] [address] [phone]           Newborn Data: Live born female  Birth Weight: 8 lb 15.7 oz (4075 g) APGAR: 8, 9  Newborn Delivery   Birth date/time:  03/27/2018 07:55:00 Delivery type:  C-Section, Vacuum Assisted Trial of labor:  No C-section categorization:  Repeat     Home with mother.  Teresa Roberson Teresa ObeyM Giordana Roberson 03/29/2018, 8:12 AM

## 2018-03-29 NOTE — Lactation Note (Signed)
This note was copied from a baby's chart. Lactation Consultation Note  Patient Name: Teresa Mamie Nickmanda Stamps [id number]'YToday's Date: 03/29/2018   Parents report they are ready to go home.  Parents report they feel that breastfeeding is going well.  Deny need.  Paretns have breastfeeding resource list and infor regarding BFSG for home use.  Maternal Data    Feeding    LATCH Score                   Interventions    Lactation Tools Discussed/Used     Consult Status      Teresa Roberson 03/29/2018, 6:21 PM

## 2018-05-20 ENCOUNTER — Ambulatory Visit: Payer: Self-pay | Admitting: Surgery

## 2018-05-20 DIAGNOSIS — K432 Incisional hernia without obstruction or gangrene: Secondary | ICD-10-CM | POA: Insufficient documentation

## 2018-05-20 DIAGNOSIS — Z789 Other specified health status: Secondary | ICD-10-CM | POA: Insufficient documentation

## 2018-05-20 NOTE — H&P (Signed)
Jillene Bucks Documented: 05/20/2018 10:58 AM Location: [facility] Patient #: [id number] DOB: [birth year] Married / Language: [place] / Race: White Female  History of Present Illness Ardeth Sportsman MD; 05/20/2018 11:48 AM) The patient is a 37 year old female who presents with an umbilical hernia. Note for "Umbilical hernia": ` ` ` Patient sent for surgical consultation at the request of Dr. Harold Hedge  Chief Complaint: Hernia at bellybutton. ` ` The patient is a pleasant female. Had tubal ligation & c-section 03/27/2018, 6 weeks ago. Lump noted at bellybutton. Concern for hernia. Surgical consultation requested. Prior C-section in 2016. Sounds like she had appendicitis that was records not available. 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 She does not smoke. She is currently breast-feeding. She moves her bowels most days. No history wound or skin infections. No abscesses. She does not smoke.  No personal nor family history of GI/colon cancer, inflammatory bowel disease, irritable bowel syndrome, allergy such as Celiac Sprue, dietary/dairy problems, colitis, ulcers nor gastritis. No recent sick contacts/gastroenteritis. No travel outside the country. No changes in diet. No dysphagia to solids or liquids. No significant heartburn or reflux. No hematochezia, hematemesis, coffee ground emesis. No evidence of prior gastric/peptic ulceration.  (Review of systems as stated in this history (HPI) or in the review of systems. Otherwise all other 12 point ROS are negative) ` ` `   Past Surgical History (Tanisha A. Manson Passey, RMA; 05/20/2018 10:58 AM) Appendectomy Cesarean Section - Multiple  Diagnostic Studies History (Tanisha A. Manson Passey, RMA; 05/20/2018 10:58 AM) Colonoscopy never Pap Smear 1-5 years ago  Allergies (Tanisha A. Manson Passey, RMA; 05/20/2018 10:59 AM) No Known Drug Allergies [05/20/2018]: Allergies Reconciled  Medication  History (Tanisha A. Manson Passey, RMA; 05/20/2018 10:59 AM) No Current Medications Medications Reconciled  Social History (Tanisha A. Manson Passey, RMA; 05/20/2018 10:58 AM) Alcohol use Occasional alcohol use. Caffeine use Carbonated beverages, Tea. No drug use Tobacco use Never smoker.  Family History (Tanisha A. Manson Passey, RMA; 05/20/2018 10:58 AM) Hypertension Father. Thyroid problems Father.  Pregnancy / Birth History (Tanisha A. Manson Passey, RMA; 05/20/2018 10:58 AM) Age at menarche 13 years. Contraceptive History Oral contraceptives. Length (months) of breastfeeding 3-6 Para 2 Regular periods  Other Problems (Tanisha A. Manson Passey, RMA; 05/20/2018 10:58 AM) Umbilical Hernia Repair     Review of Systems (Tanisha A. Brown RMA; 05/20/2018 10:58 AM) General Not Present- Appetite Loss, Chills, Fatigue, Fever, Night Sweats, Weight Gain and Weight Loss. Skin Not Present- Change in Wart/Mole, Dryness, Hives, Jaundice, New Lesions, Non-Healing Wounds, Rash and Ulcer. HEENT Not Present- Earache, Hearing Loss, Hoarseness, Nose Bleed, Oral Ulcers, Ringing in the Ears, Seasonal Allergies, Sinus Pain, Sore Throat, Visual Disturbances, Wears glasses/contact lenses and Yellow Eyes. Respiratory Not Present- Bloody sputum, Chronic Cough, Difficulty Breathing, Snoring and Wheezing. Breast Not Present- Breast Mass, Breast Pain, Nipple Discharge and Skin Changes. Cardiovascular Not Present- Chest Pain, Difficulty Breathing Lying Down, Leg Cramps, Palpitations, Rapid Heart Rate, Shortness of Breath and Swelling of Extremities. Gastrointestinal Not Present- Abdominal Pain, Bloating, Bloody Stool, Change in Bowel Habits, Chronic diarrhea, Constipation, Difficulty Swallowing, Excessive gas, Gets full quickly at meals, Hemorrhoids, Indigestion, Nausea, Rectal Pain and Vomiting. Female Genitourinary Not Present- Frequency, Nocturia, Painful Urination, Pelvic Pain and Urgency. Musculoskeletal Not Present- Back Pain, Joint  Pain, Joint Stiffness, Muscle Pain, Muscle Weakness and Swelling of Extremities. Neurological Not Present- Decreased Memory, Fainting, Headaches, Numbness, Seizures, Tingling, Tremor, Trouble walking and Weakness. Psychiatric Not Present- Anxiety, Bipolar, Change in Sleep Pattern, Depression, Fearful  and Frequent crying. Endocrine Not Present- Cold Intolerance, Excessive Hunger, Hair Changes, Heat Intolerance, Hot flashes and New Diabetes. Hematology Not Present- Blood Thinners, Easy Bruising, Excessive bleeding, Gland problems, HIV and Persistent Infections.  Vitals (Tanisha A. Brown RMA; 05/20/2018 10:59 AM) 05/20/2018 10:58 AM Weight: 211.6 lb Height: 66in Body Surface Area: 2.05 m Body Mass Index: 34.15 kg/m  Temp.: 98.42F  Pulse: 78 (Regular)  BP: 122/84 (Sitting, Left Arm, Standard)      Physical Exam Ardeth Sportsman MD; 05/20/2018 11:45 AM)  General Mental Status-Alert. General Appearance-Not in acute distress, Not Sickly. Orientation-Oriented X3. Hydration-Well hydrated. Voice-Normal.  Integumentary Global Assessment Upon inspection and palpation of skin surfaces of the - Axillae: non-tender, no inflammation or ulceration, no drainage. and Distribution of scalp and body hair is normal. General Characteristics Temperature - normal warmth is noted.  Head and Neck Head-normocephalic, atraumatic with no lesions or palpable masses. Face Global Assessment - atraumatic, no absence of expression. Neck Global Assessment - no abnormal movements, no bruit auscultated on the right, no bruit auscultated on the left, no decreased range of motion, non-tender. Trachea-midline. Thyroid Gland Characteristics - non-tender.  Eye Eyeball - Left-Extraocular movements intact, No Nystagmus. Eyeball - Right-Extraocular movements intact, No Nystagmus. Cornea - Left-No Hazy. Cornea - Right-No Hazy. Sclera/Conjunctiva - Left-No scleral icterus, No  Discharge. Sclera/Conjunctiva - Right-No scleral icterus, No Discharge. Pupil - Left-Direct reaction to light normal. Pupil - Right-Direct reaction to light normal.  ENMT Ears Pinna - Left - no drainage observed, no generalized tenderness observed. Right - no drainage observed, no generalized tenderness observed. Nose and Sinuses External Inspection of the Nose - no destructive lesion observed. Inspection of the nares - Left - quiet respiration. Right - quiet respiration. Mouth and Throat Lips - Upper Lip - no fissures observed, no pallor noted. Lower Lip - no fissures observed, no pallor noted. Nasopharynx - no discharge present. Oral Cavity/Oropharynx - Tongue - no dryness observed. Oral Mucosa - no cyanosis observed. Hypopharynx - no evidence of airway distress observed.  Chest and Lung Exam Inspection Movements - Normal and Symmetrical. Accessory muscles - No use of accessory muscles in breathing. Palpation Palpation of the chest reveals - Non-tender. Auscultation Breath sounds - Normal and Clear.  Cardiovascular Auscultation Rhythm - Regular. Murmurs & Other Heart Sounds - Auscultation of the heart reveals - No Murmurs and No Systolic Clicks.  Abdomen Inspection Inspection of the abdomen reveals - No Visible peristalsis and No Abnormal pulsations. Umbilicus - No Bleeding, No Urine drainage. Palpation/Percussion Palpation and Percussion of the abdomen reveal - Soft, Non Tender, No Rebound tenderness, No Rigidity (guarding) and No Cutaneous hyperesthesia. Note: Periumbilical scarring with 3 x 3 cm mass reducing to a smaller fascial defect consistent with periumbilical ventral incisional hernia. Abdomen soft. Not severely distended. No distasis recti. No umbilical or other anterior abdominal wall hernias  Female Genitourinary Sexual Maturity Tanner 5 - Adult hair pattern. Note: No vaginal bleeding nor discharge  Peripheral Vascular Upper Extremity Inspection - Left  - No Cyanotic nailbeds, Not Ischemic. Right - No Cyanotic nailbeds, Not Ischemic.  Neurologic Neurologic evaluation reveals -normal attention span and ability to concentrate, able to name objects and repeat phrases. Appropriate fund of knowledge , normal sensation and normal coordination. Mental Status Affect - not angry, not paranoid. Cranial Nerves-Normal Bilaterally. Gait-Normal.  Neuropsychiatric Mental status exam performed with findings of-able to articulate well with normal speech/language, rate, volume and coherence, thought content normal with ability to perform basic computations and apply  abstract reasoning and no evidence of hallucinations, delusions, obsessions or homicidal/suicidal ideation.  Musculoskeletal Global Assessment Spine, Ribs and Pelvis - no instability, subluxation or laxity. Right Upper Extremity - no instability, subluxation or laxity.  Lymphatic Head & Neck  General Head & Neck Lymphatics: Bilateral - Description - No Localized lymphadenopathy. Axillary  General Axillary Region: Bilateral - Description - No Localized lymphadenopathy. Femoral & Inguinal  Generalized Femoral & Inguinal Lymphatics: Left - Description - No Localized lymphadenopathy. Right - Description - No Localized lymphadenopathy.    Assessment & Plan Ardeth Sportsman MD; 05/20/2018 11:47 AM)  Sherald Hess HERNIA, WITHOUT OBSTRUCTION OR GANGRENE (K43.2) Impression: Periumbilical hernia most likely from prior incision sensitive but reducible.  I think she would benefit from surgical repair. Given the size, her obesity, near an incision and her intense activity requirements; I think she would benefit from mesh reinforcement. Trying to bilateral TAP blocks. Reasonable outpatient candidate. She would like to proceed with this as soon as possible. We will work to coordinate a convenient time. I did caution that she will not revealed return to work [employer]. Most likely light duty for 2  weeks and then unrestricted at 6 weeks. 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   PREOP - [facility] - ENCOUNTER FOR PREOPERATIVE EXAMINATION FOR GENERAL SURGICAL PROCEDURE (Z01.818)  Current Plans You are being scheduled for surgery- Our schedulers will call you.  You should hear from our office's scheduling department within 5 working days about the location, date, and time of surgery. We try to make accommodations for patient's preferences in scheduling surgery, but sometimes the OR schedule or the surgeon's schedule prevents [place] from making those accommodations.  If you have not heard from our office [phone]) in 5 working days, call the office and ask for your surgeon's nurse.  If you have other questions about your diagnosis, plan, or surgery, call the office and ask for your surgeon's nurse.  Written instructions provided The anatomy & physiology of the abdominal wall was discussed. The pathophysiology of hernias was discussed. Natural history risks without surgery including progeressive enlargement, pain, incarceration, & strangulation was discussed. Contributors to complications such as smoking, obesity, diabetes, prior surgery, etc were discussed.  I feel the risks of no intervention will lead to serious problems that outweigh the operative risks; therefore, I recommended surgery to reduce and repair the hernia. I explained laparoscopic techniques with possible need for an open approach. I noted the probable use of mesh to patch and/or buttress the hernia repair  Risks such as bleeding, infection, abscess, need for further treatment, heart attack, death, and other risks were discussed. I noted a good likelihood this will help address the problem. Goals of post-operative recovery were discussed as well. Possibility that this will not correct all symptoms was explained. I stressed the importance of low-impact activity, aggressive pain control, avoiding  constipation, & not pushing through pain to minimize risk of post-operative chronic pain or injury. Possibility of reherniation especially with smoking, obesity, diabetes, immunosuppression, and other health conditions was discussed. We will work to minimize complications.  An educational handout further explaining the pathology & treatment options was given as well. Questions were answered. The patient expresses understanding & wishes to proceed with surgery.  Pt Education - [facility] Hernia Post-Op HCI (Donie Lemelin): discussed with patient and provided information. Pt Education - [facility] Pain Control (Mickle Campton) Pt Education - Pamphlet Given - Laparoscopic Hernia Repair: discussed with patient and provided information. Pt Education - [facility] Mesh education:  discussed with patient and provided information.  Adin Hector, MD, FACS, MASCRS Gastrointestinal and Minimally Invasive Surgery    [address] [place], [place] [address] [phone] Main / Paging [phone] Fax

## 2018-06-21 ENCOUNTER — Encounter (HOSPITAL_COMMUNITY): Payer: Self-pay

## 2018-07-25 NOTE — Patient Instructions (Addendum)
Teresa Roberson  September 23, 1980    Your procedure is scheduled on:  07-31-2018   Report to [facility] Main  Entrance,  Report to admitting at  5:30 AM    Call this number if you have problems the morning of surgery [phone]        Remember: NO SOLID FOOD AFTER MIDNIGHT THE NIGHT PRIOR TO SURGERY. NOTHING BY MOUTH EXCEPT CLEAR LIQUIDS UNTIL 3 HOURS PRIOR TO SCHEULED SURGERY. PLEASE FINISH ENSURE   DRINK PER SURGEON ORDER 3 HOURS PRIOR TO SCHEDULED SURGERY TIME WHICH NEEDS TO BE COMPLETED AT ____4:30 AM____.                                     BRUSH YOUR TEETH MORNING OF SURGERY AND RINSE YOUR MOUTH OUT, NO CHEWING GUM CANDY OR MINTS.        Take these medicines the morning of surgery with A SIP OF WATER:  NONE                                     You may not have any metal on your body including hair pins and piercings              Do not wear jewelry, make-up, lotions, powders or perfumes, deodorant              Do not wear nail polish.  Do not shave  48 hours prior to surgery.                      Do not bring valuables to the hospital. [facility] IS NOT             RESPONSIBLE   FOR VALUABLES.  Contacts, dentures or bridgework may not be worn into surgery.  Leave suitcase in the car. After surgery it may be brought to your room.      Patients discharged the day of surgery will not be allowed to drive home.  Name and phone number of your driver:     _____________________________________________________________________      CLEAR LIQUID DIET   Foods Allowed                                                                     Foods Excluded  Coffee and tea, regular and decaf                             liquids that you cannot  Plain Jell-O in any flavor                                             see through such as: Fruit ices (not with fruit pulp)  milk, soups, orange juice  Iced  Popsicles                                    All solid food Carbonated beverages, regular and diet                                    Cranberry, grape and apple juices Sports drinks like Gatorade Lightly seasoned clear broth or consume(fat free) Sugar, honey syrup  Sample Menu Breakfast                                Lunch                                     Supper Cranberry juice                    Beef broth                            Chicken broth Jell-O                                     Grape juice                           Apple juice Coffee or tea                        Jell-O                                      Popsicle                                                Coffee or tea                        Coffee or tea  _____________________________________________________________________            [facility] Before surgery, you can play an important role.  Because skin is not sterile, your skin needs to be as free of germs as possible.  You can reduce the number of germs on your skin by washing with CHG (chlorahexidine gluconate) soap before surgery.  CHG is an antiseptic cleaner which kills germs and bonds with the skin to continue killing germs even after washing. Please DO NOT use if you have an allergy to CHG or antibacterial soaps.  If your skin becomes reddened/irritated stop using the CHG and inform your nurse when you arrive at Short Stay. Do not shave (including legs and underarms) for at least 48 hours prior to the first CHG shower.  You may shave your face/neck. Please follow these instructions carefully:  1.  Shower with CHG Soap the night before surgery and the  morning of Surgery.  2.  If you choose to wash your hair, wash your hair first as usual with your  normal  shampoo.  3.  After you shampoo, rinse your hair and body thoroughly to remove the  shampoo.                            4.  Use CHG as you would any other liquid soap.  You can  apply chg directly  to the skin and wash                       Gently with a scrungie or clean washcloth.  5.  Apply the CHG Soap to your body ONLY FROM THE NECK DOWN.   Do not use on face/ open                           Wound or open sores. Avoid contact with eyes, ears mouth and genitals (private parts).                       Wash face,  Genitals (private parts) with your normal soap.             6.  Wash thoroughly, paying special attention to the area where your surgery  will be performed.  7.  Thoroughly rinse your body with warm water from the neck down.  8.  DO NOT shower/wash with your normal soap after using and rinsing off  the CHG Soap.             9.  Pat yourself dry with a clean towel.            10.  Wear clean pajamas.            11.  Place clean sheets on your bed the night of your first shower and do not  sleep with pets. Day of Surgery : Do not apply any lotions/deodorants the morning of surgery.  Please wear clean clothes to the hospital/surgery center.  FAILURE TO FOLLOW THESE INSTRUCTIONS MAY RESULT IN THE CANCELLATION OF YOUR SURGERY PATIENT SIGNATURE_________________________________  NURSE SIGNATURE__________________________________  ________________________________________________________________________

## 2018-07-28 ENCOUNTER — Encounter (HOSPITAL_COMMUNITY)
Admission: RE | Admit: 2018-07-28 | Discharge: 2018-07-28 | Disposition: A | Discharge status: Home / Self Care | Payer: BC Managed Care – PPO | Source: Ambulatory Visit | Attending: Surgery | Admitting: Surgery

## 2018-07-28 ENCOUNTER — Other Ambulatory Visit: Payer: Self-pay

## 2018-07-28 ENCOUNTER — Encounter (HOSPITAL_COMMUNITY): Payer: Self-pay | Admitting: *Deleted

## 2018-07-28 DIAGNOSIS — Z01812 Encounter for preprocedural laboratory examination: Secondary | ICD-10-CM | POA: Diagnosis present

## 2018-07-28 HISTORY — DX: Umbilical hernia without obstruction or gangrene: K42.9

## 2018-07-28 LAB — BASIC METABOLIC PANEL
Anion gap: 7 (ref 5–15)
BUN: 16 mg/dL (ref 6–20)
CO2: 28 mmol/L (ref 22–32)
Calcium: 9.1 mg/dL (ref 8.9–10.3)
Chloride: 103 mmol/L (ref 98–111)
Creatinine, Ser: 0.77 mg/dL (ref 0.44–1.00)
GFR calc non Af Amer: >60 mL/min (ref >60)
Glucose, Bld: 96 mg/dL (ref 70–99)
Potassium: 3.9 mmol/L (ref 3.5–5.1)
SODIUM: 138 mmol/L (ref 135–145)

## 2018-07-28 LAB — CBC
HCT: 38.9 % (ref 36.0–46.0)
Hemoglobin: 12.4 g/dL (ref 12.0–15.0)
MCH: 28.6 pg (ref 26.0–34.0)
MCHC: 31.9 g/dL (ref 30.0–36.0)
MCV: 89.6 fL (ref 80.0–100.0)
Platelets: 231 10*3/uL (ref 150–400)
RBC: 4.34 MIL/uL (ref 3.87–5.11)
RDW: 13.2 % (ref 11.5–15.5)
WBC: 7.4 10*3/uL (ref 4.0–10.5)
nRBC: 0 % (ref 0.0–0.2)

## 2018-07-28 LAB — HCG, SERUM, QUALITATIVE: Preg, Serum: NEGATIVE

## 2018-07-28 MED ORDER — ENSURE PRE-SURGERY PO LIQD
296.0000 mL | Freq: Once | ORAL | Status: DC
  Filled 2018-07-28: qty 296

## 2018-07-30 NOTE — Anesthesia Preprocedure Evaluation (Addendum)
Anesthesia Evaluation  Patient identified by MRN, date of birth, ID band Patient awake    Reviewed: Allergy & Precautions, NPO status , Patient's Chart, lab work & pertinent test results  Airway Mallampati: I  TM Distance: >3 FB Neck ROM: Full    Dental no notable dental hx. (+) Teeth Intact, Dental Advisory Given   Pulmonary neg pulmonary ROS,    Pulmonary exam normal breath sounds clear to auscultation       Cardiovascular negative cardio ROS Normal cardiovascular exam Rhythm:Regular Rate:Normal     Neuro/Psych negative neurological ROS  negative psych ROS   GI/Hepatic negative GI ROS, Neg liver ROS,   Endo/Other  negative endocrine ROS  Renal/GU negative Renal ROS  negative genitourinary   Musculoskeletal negative musculoskeletal ROS (+)   Abdominal   Peds  Hematology negative hematology ROS (+)   Anesthesia Other Findings   Reproductive/Obstetrics                            Anesthesia Physical Anesthesia Plan  ASA: I  Anesthesia Plan: General   Post-op Pain Management:    Induction: Intravenous  PONV Risk Score and Plan: 3 and Midazolam, Dexamethasone and Ondansetron  Airway Management Planned: Oral ETT  Additional Equipment:   Intra-op Plan:   Post-operative Plan: Extubation in OR  Informed Consent: I have reviewed the patients History and Physical, chart, labs and discussed the procedure including the risks, benefits and alternatives for the proposed anesthesia with the patient or authorized representative who has indicated his/her understanding and acceptance.   Dental advisory given  Plan Discussed with: CRNA  Anesthesia Plan Comments:         Anesthesia Quick Evaluation

## 2018-07-31 ENCOUNTER — Other Ambulatory Visit: Payer: Self-pay

## 2018-07-31 ENCOUNTER — Ambulatory Visit (HOSPITAL_COMMUNITY)
Admission: RE | Admit: 2018-07-31 | Discharge: 2018-07-31 | Disposition: A | Discharge status: Home / Self Care | Payer: BC Managed Care – PPO | Source: Ambulatory Visit | Attending: Surgery | Admitting: Surgery

## 2018-07-31 ENCOUNTER — Encounter (HOSPITAL_COMMUNITY)
Admission: RE | Disposition: A | Discharge status: Home / Self Care | Payer: Self-pay | Source: Ambulatory Visit | Attending: Surgery

## 2018-07-31 ENCOUNTER — Ambulatory Visit (HOSPITAL_COMMUNITY): Payer: BC Managed Care – PPO | Admitting: Anesthesiology

## 2018-07-31 ENCOUNTER — Encounter (HOSPITAL_COMMUNITY): Payer: Self-pay | Admitting: Anesthesiology

## 2018-07-31 DIAGNOSIS — M6208 Separation of muscle (nontraumatic), other site: Secondary | ICD-10-CM | POA: Diagnosis not present

## 2018-07-31 DIAGNOSIS — E669 Obesity, unspecified: Secondary | ICD-10-CM

## 2018-07-31 DIAGNOSIS — Z6834 Body mass index (BMI) 34.0-34.9, adult: Secondary | ICD-10-CM | POA: Insufficient documentation

## 2018-07-31 DIAGNOSIS — K432 Incisional hernia without obstruction or gangrene: Secondary | ICD-10-CM | POA: Diagnosis not present

## 2018-07-31 HISTORY — PX: INSERTION OF MESH: SHX5868

## 2018-07-31 HISTORY — PX: VENTRAL HERNIA REPAIR: SHX424

## 2018-07-31 SURGERY — REPAIR, HERNIA, VENTRAL, LAPAROSCOPIC
Anesthesia: General | Site: Abdomen

## 2018-07-31 MED ORDER — ONDANSETRON HCL 4 MG/2ML IJ SOLN
INTRAMUSCULAR | Status: AC
  Filled 2018-07-31: qty 6

## 2018-07-31 MED ORDER — TRAMADOL HCL 50 MG PO TABS: 50.0000 mg | ORAL_TABLET | Freq: Four times a day (QID) | ORAL | 0 refills | Status: AC | PRN

## 2018-07-31 MED ORDER — SUGAMMADEX SODIUM 200 MG/2ML IV SOLN
INTRAVENOUS | Status: AC
  Filled 2018-07-31: qty 2

## 2018-07-31 MED ORDER — BUPIVACAINE-EPINEPHRINE (PF) 0.25% -1:200000 IJ SOLN
INTRAMUSCULAR | Status: AC
  Filled 2018-07-31: qty 30

## 2018-07-31 MED ORDER — KETAMINE HCL 10 MG/ML IJ SOLN
INTRAMUSCULAR | Status: DC | PRN
  Administered 2018-07-31: 20 mg via INTRAVENOUS
  Administered 2018-07-31 (×3): 10 mg via INTRAVENOUS

## 2018-07-31 MED ORDER — GABAPENTIN 300 MG PO CAPS: 300.0000 mg | ORAL_CAPSULE | Freq: Two times a day (BID) | ORAL | 1 refills | Status: AC

## 2018-07-31 MED ORDER — PROPOFOL 10 MG/ML IV BOLUS
INTRAVENOUS | Status: AC
  Filled 2018-07-31: qty 40

## 2018-07-31 MED ORDER — SODIUM CHLORIDE (PF) 0.9 % IJ SOLN
INTRAMUSCULAR | Status: AC
  Filled 2018-07-31: qty 50

## 2018-07-31 MED ORDER — EPHEDRINE 5 MG/ML INJ
INTRAVENOUS | Status: AC
  Filled 2018-07-31: qty 10

## 2018-07-31 MED ORDER — KETAMINE HCL 10 MG/ML IJ SOLN
INTRAMUSCULAR | Status: AC
  Filled 2018-07-31: qty 1

## 2018-07-31 MED ORDER — SUGAMMADEX SODIUM 200 MG/2ML IV SOLN
INTRAVENOUS | Status: DC | PRN
  Administered 2018-07-31: 200 mg via INTRAVENOUS

## 2018-07-31 MED ORDER — SODIUM CHLORIDE 0.9 % IR SOLN
Status: DC | PRN
  Administered 2018-07-31: 1000 mL

## 2018-07-31 MED ORDER — LIDOCAINE 2% (20 MG/ML) 5 ML SYRINGE
INTRAMUSCULAR | Status: AC
  Filled 2018-07-31: qty 15

## 2018-07-31 MED ORDER — DEXAMETHASONE SODIUM PHOSPHATE 10 MG/ML IJ SOLN
INTRAMUSCULAR | Status: DC | PRN
  Administered 2018-07-31: 10 mg via INTRAVENOUS

## 2018-07-31 MED ORDER — CEFAZOLIN SODIUM-DEXTROSE 2-4 GM/100ML-% IV SOLN
2.0000 g | INTRAVENOUS | Status: AC
  Administered 2018-07-31: 2 g via INTRAVENOUS
  Filled 2018-07-31: qty 100

## 2018-07-31 MED ORDER — LIDOCAINE HCL 2 % IJ SOLN
INTRAMUSCULAR | Status: AC
  Filled 2018-07-31: qty 20

## 2018-07-31 MED ORDER — MIDAZOLAM HCL 2 MG/2ML IJ SOLN
INTRAMUSCULAR | Status: AC
  Filled 2018-07-31: qty 2

## 2018-07-31 MED ORDER — BUPIVACAINE-EPINEPHRINE 0.25% -1:200000 IJ SOLN
INTRAMUSCULAR | Status: DC | PRN
  Administered 2018-07-31: 60 mL

## 2018-07-31 MED ORDER — ROCURONIUM BROMIDE 10 MG/ML (PF) SYRINGE
PREFILLED_SYRINGE | INTRAVENOUS | Status: DC | PRN
  Administered 2018-07-31 (×2): 10 mg via INTRAVENOUS
  Administered 2018-07-31: 55 mg via INTRAVENOUS
  Administered 2018-07-31: 15 mg via INTRAVENOUS

## 2018-07-31 MED ORDER — FENTANYL CITRATE (PF) 100 MCG/2ML IJ SOLN
INTRAMUSCULAR | Status: AC
  Filled 2018-07-31: qty 2

## 2018-07-31 MED ORDER — FENTANYL CITRATE (PF) 100 MCG/2ML IJ SOLN
25.0000 ug | INTRAMUSCULAR | Status: DC | PRN
  Administered 2018-07-31 (×3): 50 ug via INTRAVENOUS

## 2018-07-31 MED ORDER — SCOPOLAMINE 1 MG/3DAYS TD PT72
MEDICATED_PATCH | TRANSDERMAL | Status: AC
  Administered 2018-07-31: 1.5 mg
  Filled 2018-07-31: qty 1

## 2018-07-31 MED ORDER — LIDOCAINE 2% (20 MG/ML) 5 ML SYRINGE
INTRAMUSCULAR | Status: DC | PRN
  Administered 2018-07-31: 1.5 mg/kg/h via INTRAVENOUS

## 2018-07-31 MED ORDER — FENTANYL CITRATE (PF) 100 MCG/2ML IJ SOLN
INTRAMUSCULAR | Status: AC
  Administered 2018-07-31: 50 ug via INTRAVENOUS
  Filled 2018-07-31: qty 2

## 2018-07-31 MED ORDER — GLYCOPYRROLATE PF 0.2 MG/ML IJ SOSY
PREFILLED_SYRINGE | INTRAMUSCULAR | Status: AC
  Filled 2018-07-31: qty 2

## 2018-07-31 MED ORDER — EPHEDRINE SULFATE-NACL 50-0.9 MG/10ML-% IV SOSY
PREFILLED_SYRINGE | INTRAVENOUS | Status: DC | PRN
  Administered 2018-07-31: 10 mg via INTRAVENOUS

## 2018-07-31 MED ORDER — PROPOFOL 10 MG/ML IV BOLUS
INTRAVENOUS | Status: DC | PRN
  Administered 2018-07-31: 200 mg via INTRAVENOUS

## 2018-07-31 MED ORDER — FENTANYL CITRATE (PF) 100 MCG/2ML IJ SOLN
INTRAMUSCULAR | Status: DC | PRN
  Administered 2018-07-31: 50 ug via INTRAVENOUS
  Administered 2018-07-31: 100 ug via INTRAVENOUS
  Administered 2018-07-31: 50 ug via INTRAVENOUS

## 2018-07-31 MED ORDER — CELECOXIB 200 MG PO CAPS
200.0000 mg | ORAL_CAPSULE | ORAL | Status: AC
  Administered 2018-07-31: 200 mg via ORAL
  Filled 2018-07-31: qty 1

## 2018-07-31 MED ORDER — MIDAZOLAM HCL 5 MG/5ML IJ SOLN
INTRAMUSCULAR | Status: DC | PRN
  Administered 2018-07-31: 2 mg via INTRAVENOUS

## 2018-07-31 MED ORDER — LIDOCAINE 2% (20 MG/ML) 5 ML SYRINGE
INTRAMUSCULAR | Status: DC | PRN
  Administered 2018-07-31: 100 mg via INTRAVENOUS

## 2018-07-31 MED ORDER — DEXAMETHASONE SODIUM PHOSPHATE 4 MG/ML IJ SOLN: 4.0000 mg | INTRAMUSCULAR | Status: DC

## 2018-07-31 MED ORDER — ONDANSETRON HCL 4 MG/2ML IJ SOLN
INTRAMUSCULAR | Status: DC | PRN
  Administered 2018-07-31: 4 mg via INTRAVENOUS

## 2018-07-31 MED ORDER — ROCURONIUM BROMIDE 10 MG/ML (PF) SYRINGE
PREFILLED_SYRINGE | INTRAVENOUS | Status: AC
  Filled 2018-07-31: qty 30

## 2018-07-31 MED ORDER — BUPIVACAINE LIPOSOME 1.3 % IJ SUSP
20.0000 mL | Freq: Once | INTRAMUSCULAR | Status: AC
  Administered 2018-07-31: 20 mL
  Filled 2018-07-31: qty 20

## 2018-07-31 MED ORDER — ACETAMINOPHEN 500 MG PO TABS
1000.0000 mg | ORAL_TABLET | ORAL | Status: AC
  Administered 2018-07-31: 1000 mg via ORAL
  Filled 2018-07-31: qty 2

## 2018-07-31 MED ORDER — DEXAMETHASONE SODIUM PHOSPHATE 10 MG/ML IJ SOLN
INTRAMUSCULAR | Status: AC
  Filled 2018-07-31: qty 3

## 2018-07-31 MED ORDER — GABAPENTIN 300 MG PO CAPS
300.0000 mg | ORAL_CAPSULE | ORAL | Status: AC
  Administered 2018-07-31: 300 mg via ORAL
  Filled 2018-07-31: qty 1

## 2018-07-31 MED ORDER — LACTATED RINGERS IV SOLN
INTRAVENOUS | Status: DC
  Administered 2018-07-31: 07:00:00 via INTRAVENOUS

## 2018-07-31 SURGICAL SUPPLY — 41 items
APPLIER CLIP 5 13 M/L LIGAMAX5 (MISCELLANEOUS)
BINDER ABDOMINAL 12 ML 46-62 (SOFTGOODS) ×3 IMPLANT
CABLE HIGH FREQUENCY MONO STRZ (ELECTRODE) ×3 IMPLANT
CHLORAPREP W/TINT 26ML (MISCELLANEOUS) ×3 IMPLANT
CLIP APPLIE 5 13 M/L LIGAMAX5 (MISCELLANEOUS) IMPLANT
CLOSURE WOUND 1/2 X4 (GAUZE/BANDAGES/DRESSINGS) ×1
COVER SURGICAL LIGHT HANDLE (MISCELLANEOUS) ×3 IMPLANT
COVER WAND RF STERILE (DRAPES) ×3 IMPLANT
DECANTER SPIKE VIAL GLASS SM (MISCELLANEOUS) ×3 IMPLANT
DEVICE SECURE STRAP 25 ABSORB (INSTRUMENTS) IMPLANT
DEVICE TROCAR PUNCTURE CLOSURE (ENDOMECHANICALS) ×3 IMPLANT
DRAPE WARM FLUID 44X44 (DRAPE) ×3 IMPLANT
DRSG TEGADERM 2-3/8X2-3/4 SM (GAUZE/BANDAGES/DRESSINGS) ×3 IMPLANT
DRSG TEGADERM 4X4.75 (GAUZE/BANDAGES/DRESSINGS) ×3 IMPLANT
ELECT REM PT RETURN 15FT ADLT (MISCELLANEOUS) ×3 IMPLANT
GAUZE SPONGE 2X2 8PLY STRL LF (GAUZE/BANDAGES/DRESSINGS) ×1 IMPLANT
GLOVE ECLIPSE 8.0 STRL XLNG CF (GLOVE) ×3 IMPLANT
GLOVE INDICATOR 8.0 STRL GRN (GLOVE) ×3 IMPLANT
GOWN STRL REUS W/TWL XL LVL3 (GOWN DISPOSABLE) ×6 IMPLANT
IRRIG SUCT STRYKERFLOW 2 WTIP (MISCELLANEOUS)
IRRIGATION SUCT STRKRFLW 2 WTP (MISCELLANEOUS) IMPLANT
KIT BASIN OR (CUSTOM PROCEDURE TRAY) ×3 IMPLANT
MARKER SKIN DUAL TIP RULER LAB (MISCELLANEOUS) ×3 IMPLANT
MESH VENTRALIGHT ST 6X8 (Mesh Specialty) ×2 IMPLANT
MESH VENTRLGHT ELLIPSE 8X6XMFL (Mesh Specialty) ×1 IMPLANT
NEEDLE SPNL 22GX3.5 QUINCKE BK (NEEDLE) IMPLANT
PAD POSITIONING PINK XL (MISCELLANEOUS) ×3 IMPLANT
SCISSORS LAP 5X35 DISP (ENDOMECHANICALS) ×3 IMPLANT
SHEARS HARMONIC ACE PLUS 36CM (ENDOMECHANICALS) ×3 IMPLANT
SLEEVE ADV FIXATION 5X100MM (TROCAR) ×3 IMPLANT
SPONGE GAUZE 2X2 STER 10/PKG (GAUZE/BANDAGES/DRESSINGS) ×2
STRIP CLOSURE SKIN 1/2X4 (GAUZE/BANDAGES/DRESSINGS) ×2 IMPLANT
SUT MNCRL AB 4-0 PS2 18 (SUTURE) ×6 IMPLANT
SUT PDS AB 1 CT1 27 (SUTURE) ×6 IMPLANT
SUT PROLENE 1 CT 1 30 (SUTURE) ×15 IMPLANT
TOWEL OR 17X26 10 PK STRL BLUE (TOWEL DISPOSABLE) ×3 IMPLANT
TRAY LAPAROSCOPIC (CUSTOM PROCEDURE TRAY) ×3 IMPLANT
TROCAR ADV FIXATION 11X100MM (TROCAR) IMPLANT
TROCAR ADV FIXATION 5X100MM (TROCAR) ×3 IMPLANT
TROCAR BLADELESS OPT 5 100 (ENDOMECHANICALS) ×3 IMPLANT
TUBING INSUF HEATED (TUBING) ×3 IMPLANT

## 2018-07-31 NOTE — Op Note (Signed)
07/31/2018  PATIENT:  Teresa BucksAmanda S Roberson  37 y.o. female  Patient Care Team: Laurann MontanaWhite, Cynthia, MD as PCP - General (Family Medicine) Karie SodaGross, Ivelisse Culverhouse, MD as Consulting Physician (General Surgery)  PRE-OPERATIVE DIAGNOSIS:  Incisional abdominal wall hernia (periumbilical)  POST-OPERATIVE DIAGNOSIS:    Incisional abdominal wall hernia  Diastasis recti  PROCEDURE:  LAPAROSCOPIC REPAIR OF  Periumbilical Incisional abdominal wall hernia  HERNIA WITH MESH  SURGEON:  Ardeth SportsmanSteven C. Juli Odom, MD  ASSISTANT: Nurse   ANESTHESIA:     General  Nerve block provided with liposomal bupivacaine (Experel) mixed with 0.25% bupivacaine as a Bilateral TAP block x30 mL at the level of the transverse abdominis & preperitoneal spaces along the flank at the anterior axillary line, from subcostal ridge to iliac crest under laparoscopic guidance   Local anesthesia field block: (0.25% bupivacaine & liposomal  Bupivacaine [Experel])  EBL:  Total I/O In: 700 [I.V.:700] Out: 20 [Blood:20]  Per anesthesia record  Delay start of Pharmacological VTE agent (>24hrs) due to surgical blood loss or risk of bleeding:  no  DRAINS: none   SPECIMEN:  No Specimen  DISPOSITION OF SPECIMEN:  N/A  COUNTS:  YES  PLAN OF CARE: Discharge to home after PACU  PATIENT DISPOSITION:  PACU - hemodynamically stable.  INDICATION: Pleasant patient has developed a ventral wall abdominal hernia.   Recommendation was made for surgical repair:  The anatomy & physiology of the abdominal wall was discussed. The pathophysiology of hernias was discussed. Natural history risks without surgery including progeressive enlargement, pain, incarceration & strangulation was discussed. Contributors to complications such as smoking, obesity, diabetes, prior surgery, etc were discussed.  I feel the risks of no intervention will lead to serious problems that outweigh the operative risks; therefore, I recommended surgery to reduce and repair the hernia. I  explained laparoscopic techniques with possible need for an open approach. I noted the probable use of mesh to patch and/or buttress the hernia repair  Risks such as bleeding, infection, abscess, need for further treatment, heart attack, death, and other risks were discussed. I noted a good likelihood this will help address the problem. Goals of post-operative recovery were discussed as well. Possibility that this will not correct all symptoms was explained. I stressed the importance of low-impact activity, aggressive pain control, avoiding constipation, & not pushing through pain to minimize risk of post-operative chronic pain or injury. Possibility of reherniation especially with smoking, obesity, diabetes, immunosuppression, and other health conditions was discussed. We will work to minimize complications.  An educational handout further explaining the pathology & treatment options was given as well. Questions were answered. The patient expresses understanding & wishes to proceed with surgery.   OR FINDINGS: 3 cm periumbilical fascial defect in setting of diastases recti.  Type of repair: Laparoscopic underlay repair   Placement of mesh: Intraperitoneal underlay repair  Name of mesh: Bard Ventralight dual sided (polypropylene / Seprafilm)  Size of mesh: 20x15cm  Orientation: Transverse  Mesh overlap:  5-7cm   DESCRIPTION:   Informed consent was confirmed. The patient underwent general anaesthesia without difficulty. The patient was positioned appropriately. VTE prevention in place. The patient's abdomen was clipped, prepped, & draped in a sterile fashion. Surgical timeout confirmed our plan.  The patient was positioned in reverse Trendelenburg. Abdominal entry was gained using optical entry technique in the left upper abdomen. Entry was clean. I induced carbon dioxide insufflation. Camera inspection revealed no injury. Extra ports were carefully placed under direct laparoscopic  visualization.   I could  see the hernia on the parietal peritoneum under the abdominal wall.  Laparoscopically freed off the falciform ligament and midline preperitoneum to help expose the posterior rectus fascia from the xiphoid down to the suprapubic region.  Focused sharp dissection and cautery with scissors.  Patient had 1 dominant periumbilical hernia as well as some laxity in setting of poor tissue quality supraumbilically in the setting of diastases recti.  I made sure hemostasis was good.  I mapped out the region using a needle passer.   To ensure that I would have at least 5 cm radial coverage outside of the hernia defect, I chose a 20x15cm dual sided mesh.  I placed #1 Prolene stitches around its edge about every 5 cm = 12 total.  I rolled the mesh & placed into the peritoneal cavity through the hernia defect.  I unrolled the mesh and positioned it appropriately.  I secured the mesh to cover up the hernia defect using a laparoscopic suture passer to pass the tails of the Prolene through the abdominal wall & tagged them with clamps for good transfascial suturing.  I started out in four corners to make sure I had the mesh centered under the hernia defect appropriately, and then proceeded to work in quadrants.    We evacuated CO2 & desufflated the abdomen.  I tied the fascial stitches down. I closed the fascial defect that I placed the mesh through using #1 PDS interrupted transverse stitches primarily.  I reinsufflated the abdomen. The mesh provided at least circumferential coverage around the entire region of hernia defects.  I secured the mesh centrally with an additional trans fascial stitch in & out the mesh using #1 PDS under laparoscopic visualization.   I tacked the edges & central part of the mesh to the peritoneum/posterior rectus fascia with SecureStrap absorbable tacks.   I did reinspection. Hemostasis was good. Mesh laid well. I completed a broad field block of local anesthesia at fascial  stitch sites & fascial closure areas.    Capnoperitoneum was evacuated. Ports were removed. The skin was closed with Monocryl at the port sites and Steri-Strips on the fascial stitch puncture sites.  Patient is being extubated to go to the recovery room.  I discussed operative findings, updated the patient's status, discussed probable steps to recovery, and gave postoperative recommendations to the Patient's husband's & mother.  Recommendations were made.  Questions were answered.  They expressed understanding & appreciation.  Ardeth Sportsman, M.D., F.A.C.S. Gastrointestinal and Minimally Invasive Surgery [facility] [address] [place], [place] [address] [phone] Main / Paging  07/31/2018 9:38 AM

## 2018-07-31 NOTE — Discharge Instructions (Signed)
HERNIA REPAIR: POST OP INSTRUCTIONS ° °###################################################################### ° °EAT °Gradually transition to a high fiber diet with a fiber supplement over the next few weeks after discharge.  Start with a pureed / full liquid diet (see below) ° °WALK °Walk an hour a day.  Control your pain to do that.   ° °CONTROL PAIN °Control pain so that you can walk, sleep, tolerate sneezing/coughing, and go up/down stairs. ° °HAVE A BOWEL MOVEMENT DAILY °Keep your bowels regular to avoid problems.  OK to try a laxative to override constipation.  OK to use an antidairrheal to slow down diarrhea.  Call if not better after 2 tries ° °CALL IF YOU HAVE PROBLEMS/CONCERNS °Call if you are still struggling despite following these instructions. °Call if you have concerns not answered by these instructions ° °###################################################################### ° ° ° °1. DIET: Follow a light bland diet the first 24 hours after arrival home, such as soup, liquids, crackers, etc.  Be sure to include lots of fluids daily.  Advance to a low fat / high fiber diet over the next few days after surgery.  Avoid fast food or heavy meals the first week as your are more likely to get nauseated.   ° °2. Take your usually prescribed home medications unless otherwise directed. ° °3. PAIN CONTROL: °a. Pain is best controlled by a usual combination of three different methods TOGETHER: °i. Ice/Heat °ii. Over the counter pain medication °iii. Prescription pain medication °b. Most patients will experience some swelling and bruising around the hernia(s) such as the bellybutton, groins, or old incisions.  Ice packs or heating pads (30-60 minutes up to 6 times a day) will help. Use ice for the first few days to help decrease swelling and bruising, then switch to heat to help relax tight/sore spots and speed recovery.  Some people prefer to use ice alone, heat alone, alternating between ice & heat.  Experiment  to what works for you.  Swelling and bruising can take several weeks to resolve.   °c. It is helpful to take an over-the-counter pain medication regularly for the first few weeks.  Choose one of the following that works best for you: °i. Naproxen (Aleve, etc)  Two 220mg tabs twice a day °ii. Ibuprofen (Advil, etc) Three 200mg tabs four times a day (every meal & bedtime) °iii. Acetaminophen (Tylenol, etc) 325-650mg four times a day (every meal & bedtime) °d. A  prescription for pain medication should be given to you upon discharge.  Take your pain medication as prescribed.  °i. If you are having problems/concerns with the prescription medicine (does not control pain, nausea, vomiting, rash, itching, etc), please call us [phone] to see if we need to switch you to a different pain medicine that will work better for you and/or control your side effect better. °ii. If you need a refill on your pain medication, please contact your pharmacy.  They will contact our office to request authorization. Prescriptions will not be filled after 5 pm or on week-ends. ° °4. Avoid getting constipated.  Between the surgery and the pain medications, it is common to experience some constipation.  Increasing fluid intake and taking a fiber supplement (such as Metamucil, Citrucel, FiberCon, MiraLax, etc) 1-2 times a day regularly will usually help prevent this problem from occurring.  A mild laxative (prune juice, Milk of Magnesia, MiraLax, etc) should be taken according to package directions if there are no bowel movements after 48 hours.   ° °5. Wash / shower every   day.  You may shower over the dressings as they are waterproof.   ° °6. Remove your waterproof bandages, skin tapes, and other bandages 5 days after surgery. You may replace a dressing/Band-Aid to cover the incision for comfort if you wish. You may leave the incisions open to air.  You may replace a dressing/Band-Aid to cover an incision for comfort if you wish.   Continue to shower over incision(s) after the dressing is off. ° °7. ACTIVITIES as tolerated:   °a. You may resume regular (light) daily activities beginning the next day--such as daily self-care, walking, climbing stairs--gradually increasing activities as tolerated.  Control your pain so that you can walk an hour a day.  If you can walk 30 minutes without difficulty, it is safe to try more intense activity such as jogging, treadmill, bicycling, low-impact aerobics, swimming, etc. °b. Save the most intensive and strenuous activity for last such as sit-ups, heavy lifting, contact sports, etc  Refrain from any heavy lifting or straining until you are off narcotics for pain control.   °c. DO NOT PUSH THROUGH PAIN.  Let pain be your guide: If it hurts to do something, don't do it.  Pain is your body warning you to avoid that activity for another week until the pain goes down. °d. You may drive when you are no longer taking prescription pain medication, you can comfortably wear a seatbelt, and you can safely maneuver your car and apply brakes. °e. You may have sexual intercourse when it is comfortable.  ° °8. FOLLOW UP in our office °a. Please call [facility] at [phone] to set up an appointment to see your surgeon in the office for a follow-up appointment approximately 2-3 weeks after your surgery. °b. Make sure that you call for this appointment the day you arrive home to insure a convenient appointment time. ° °9.  If you have disability of FMLA / Family leave forms, please bring the forms to the office for processing.  (do not give to your surgeon). ° °WHEN TO CALL US [phone]: °1. Poor pain control °2. Reactions / problems with new medications (rash/itching, nausea, etc)  °3. Fever over 101.5 F (38.5 C) °4. Inability to urinate °5. Nausea and/or vomiting °6. Worsening swelling or bruising °7. Continued bleeding from incision. °8. Increased pain, redness, or drainage from the incision ° ° The clinic staff is  available to answer your questions during regular business hours (8:30am-5pm).  Please don’t hesitate to call and ask to speak to one of our nurses for clinical concerns.  ° If you have a medical emergency, go to the nearest emergency room or call 911. ° A surgeon from [facility] is always on call at the hospitals in [place] ° °[facility] °[address], [address], [place], [place]  [address] ? ° [address] [address], [place], [place]   [address] °MAIN: [phone] ? TOLL FREE: [phone] ? FAX: [phone] °[email or web address] ° °

## 2018-07-31 NOTE — Anesthesia Postprocedure Evaluation (Signed)
Anesthesia Post Note  Patient: Teresa Roberson  Procedure(s) Performed: LAPAROSCOPIC VENTRAL WALL HERNIA REPAIR ERAS PATHWAY (N/A Abdomen) INSERTION OF MESH (N/A Abdomen)     Patient location during evaluation: PACU Anesthesia Type: General Level of consciousness: awake and alert Pain management: pain level controlled Vital Signs Assessment: post-procedure vital signs reviewed and stable Respiratory status: spontaneous breathing, nonlabored ventilation, respiratory function stable and patient connected to nasal cannula oxygen Cardiovascular status: blood pressure returned to baseline and stable Postop Assessment: no apparent nausea or vomiting Anesthetic complications: no    Last Vitals:  Vitals:   07/31/18 1015 07/31/18 1030  BP: 101/62 107/84  Pulse: 72 72  Resp: 19 14  Temp: 36.7 C 36.7 C  SpO2: 99% 100%    Last Pain:  Vitals:   07/31/18 1030  TempSrc: Oral  PainSc: 2                  Taniesha Glanz L Ahley Bulls

## 2018-07-31 NOTE — Transfer of Care (Signed)
Immediate Anesthesia Transfer of Care Note  Patient: Teresa Roberson  Procedure(s) Performed: Procedure(s): LAPAROSCOPIC VENTRAL WALL HERNIA REPAIR ERAS PATHWAY (N/A) INSERTION OF MESH (N/A)  Patient Location: PACU  Anesthesia Type:General  Level of Consciousness:  sedated, patient cooperative and responds to stimulation  Airway & Oxygen Therapy:Patient Spontanous Breathing and Patient connected to face mask oxgen  Post-op Assessment:  Report given to PACU RN and Post -op Vital signs reviewed and stable  Post vital signs:  Reviewed and stable  Last Vitals:  Vitals:   07/31/18 0928 07/31/18 0930  BP: 122/77 112/75  Pulse: 80 75  Resp: 20 20  Temp: 36.6 C   SpO2: 100% 100%    Complications: No apparent anesthesia complications

## 2018-07-31 NOTE — Progress Notes (Signed)
Pt has voided, tolerated fluids, denies pain or nausea, ambulated well; dressed and wants to go home, d/c to car via w/c

## 2018-07-31 NOTE — Anesthesia Procedure Notes (Signed)
Procedure Name: Intubation Date/Time: 07/31/2018 7:44 AM Performed by: Lavina Hamman, CRNA Pre-anesthesia Checklist: Patient identified, Emergency Drugs available, Suction available, Patient being monitored and Timeout performed Patient Re-evaluated:Patient Re-evaluated prior to induction Oxygen Delivery Method: Circle system utilized Preoxygenation: Pre-oxygenation with 100% oxygen Induction Type: IV induction Ventilation: Mask ventilation without difficulty Laryngoscope Size: Mac and 3 Grade View: Grade I Tube type: Oral Tube size: 7.0 mm Number of attempts: 1 Airway Equipment and Method: Stylet Placement Confirmation: ETT inserted through vocal cords under direct vision,  positive ETCO2,  CO2 detector and breath sounds checked- equal and bilateral Secured at: 21 cm Tube secured with: Tape Dental Injury: Teeth and Oropharynx as per pre-operative assessment

## 2018-07-31 NOTE — H&P (Signed)
Teresa Roberson DOB: 12/13/1980  Patient Care Team: Laurann Montana, MD as PCP - General (Family Medicine) Karie Soda, MD as Consulting Physician (General Surgery)  ` Patient sent for surgical consultation at the request of Dr. Harold Hedge  Chief Complaint: Hernia at bellybutton. ` ` The patient is a pleasant female. Had tubal ligation & c-section 03/27/2018, 6 weeks ago. Lump noted at bellybutton. Concern for hernia. Surgical consultation requested. Prior C-section in 2016. Sounds like she had appendicitis that was records not available. 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 She does not smoke. She is currently breast-feeding. She moves her bowels most days. No history wound or skin infections. No abscesses. She does not smoke.  No personal nor family history of GI/colon cancer, inflammatory bowel disease, irritable bowel syndrome, allergy such as Celiac Sprue, dietary/dairy problems, colitis, ulcers nor gastritis. No recent sick contacts/gastroenteritis. No travel outside the country. No changes in diet. No dysphagia to solids or liquids. No significant heartburn or reflux. No hematochezia, hematemesis, coffee ground emesis. No evidence of prior gastric/peptic ulceration.  (Review of systems as stated in this history (HPI) or in the review of systems. Otherwise all other 12 point ROS are negative) ` ` `   Past Surgical History (Tanisha A. Manson Passey, RMA; 05/20/2018 10:58 AM) Appendectomy Cesarean Section - Multiple  Diagnostic Studies History (Tanisha A. Manson Passey, RMA; 05/20/2018 10:58 AM) Colonoscopy never Pap Smear 1-5 years ago  Allergies (Tanisha A. Manson Passey, RMA; 05/20/2018 10:59 AM) No Known Drug Allergies [05/20/2018]: Allergies Reconciled  Medication History (Tanisha A. Manson Passey, RMA; 05/20/2018 10:59 AM) No Current Medications Medications Reconciled  Social History (Tanisha A. Manson Passey, RMA; 05/20/2018 10:58 AM) Alcohol  use Occasional alcohol use. Caffeine use Carbonated beverages, Tea. No drug use Tobacco use Never smoker.  Family History (Tanisha A. Manson Passey, RMA; 05/20/2018 10:58 AM) Hypertension Father. Thyroid problems Father.  Pregnancy / Birth History (Tanisha A. Manson Passey, RMA; 05/20/2018 10:58 AM) Age at menarche 13 years. Contraceptive History Oral contraceptives. Length (months) of breastfeeding 3-6 Para 2 Regular periods  Other Problems (Tanisha A. Manson Passey, RMA; 05/20/2018 10:58 AM) Umbilical Hernia Repair     Review of Systems (Tanisha A. Brown RMA; 05/20/2018 10:58 AM) General Not Present- Appetite Loss, Chills, Fatigue, Fever, Night Sweats, Weight Gain and Weight Loss. Skin Not Present- Change in Wart/Mole, Dryness, Hives, Jaundice, New Lesions, Non-Healing Wounds, Rash and Ulcer. HEENT Not Present- Earache, Hearing Loss, Hoarseness, Nose Bleed, Oral Ulcers, Ringing in the Ears, Seasonal Allergies, Sinus Pain, Sore Throat, Visual Disturbances, Wears glasses/contact lenses and Yellow Eyes. Respiratory Not Present- Bloody sputum, Chronic Cough, Difficulty Breathing, Snoring and Wheezing. Breast Not Present- Breast Mass, Breast Pain, Nipple Discharge and Skin Changes. Cardiovascular Not Present- Chest Pain, Difficulty Breathing Lying Down, Leg Cramps, Palpitations, Rapid Heart Rate, Shortness of Breath and Swelling of Extremities. Gastrointestinal Not Present- Abdominal Pain, Bloating, Bloody Stool, Change in Bowel Habits, Chronic diarrhea, Constipation, Difficulty Swallowing, Excessive gas, Gets full quickly at meals, Hemorrhoids, Indigestion, Nausea, Rectal Pain and Vomiting. Female Genitourinary Not Present- Frequency, Nocturia, Painful Urination, Pelvic Pain and Urgency. Musculoskeletal Not Present- Back Pain, Joint Pain, Joint Stiffness, Muscle Pain, Muscle Weakness and Swelling of Extremities. Neurological Not Present- Decreased Memory, Fainting, Headaches, Numbness, Seizures,  Tingling, Tremor, Trouble walking and Weakness. Psychiatric Not Present- Anxiety, Bipolar, Change in Sleep Pattern, Depression, Fearful and Frequent crying. Endocrine Not Present- Cold Intolerance, Excessive Hunger, Hair Changes, Heat Intolerance, Hot flashes and New Diabetes. Hematology Not Present- Blood Thinners, Easy Bruising, Excessive bleeding, Gland  problems, HIV and Persistent Infections.  Vitals (Tanisha A. Brown RMA; 05/20/2018 10:59 AM) 05/20/2018 10:58 AM Weight: 211.6 lb Height: 66in Body Surface Area: 2.05 m Body Mass Index: 34.15 kg/m  Temp.: 98.62F  Pulse: 78 (Regular)  BP: 122/84 (Sitting, Left Arm, Standard)  BP 112/72   Pulse 78   Temp 97.8 F (36.6 C) (Oral)   Resp 15   Ht 5\' 6"  (1.676 m)   Wt 97.6 kg   LMP 07/12/2018 (Exact Date)   SpO2 100%   BMI 34.72 kg/m      Physical Exam Ardeth Sportsman MD; 05/20/2018 11:45 AM)  General Mental Status-Alert. General Appearance-Not in acute distress, Not Sickly. Orientation-Oriented X3. Hydration-Well hydrated. Voice-Normal.  Integumentary Global Assessment Upon inspection and palpation of skin surfaces of the - Axillae: non-tender, no inflammation or ulceration, no drainage. and Distribution of scalp and body hair is normal. General Characteristics Temperature - normal warmth is noted.  Head and Neck Head-normocephalic, atraumatic with no lesions or palpable masses. Face Global Assessment - atraumatic, no absence of expression. Neck Global Assessment - no abnormal movements, no bruit auscultated on the right, no bruit auscultated on the left, no decreased range of motion, non-tender. Trachea-midline. Thyroid Gland Characteristics - non-tender.  Eye Eyeball - Left-Extraocular movements intact, No Nystagmus. Eyeball - Right-Extraocular movements intact, No Nystagmus. Cornea - Left-No Hazy. Cornea - Right-No Hazy. Sclera/Conjunctiva - Left-No scleral icterus,  No Discharge. Sclera/Conjunctiva - Right-No scleral icterus, No Discharge. Pupil - Left-Direct reaction to light normal. Pupil - Right-Direct reaction to light normal.  ENMT Ears Pinna - Left - no drainage observed, no generalized tenderness observed. Right - no drainage observed, no generalized tenderness observed. Nose and Sinuses External Inspection of the Nose - no destructive lesion observed. Inspection of the nares - Left - quiet respiration. Right - quiet respiration. Mouth and Throat Lips - Upper Lip - no fissures observed, no pallor noted. Lower Lip - no fissures observed, no pallor noted. Nasopharynx - no discharge present. Oral Cavity/Oropharynx - Tongue - no dryness observed. Oral Mucosa - no cyanosis observed. Hypopharynx - no evidence of airway distress observed.  Chest and Lung Exam Inspection Movements - Normal and Symmetrical. Accessory muscles - No use of accessory muscles in breathing. Palpation Palpation of the chest reveals - Non-tender. Auscultation Breath sounds - Normal and Clear.  Cardiovascular Auscultation Rhythm - Regular. Murmurs & Other Heart Sounds - Auscultation of the heart reveals - No Murmurs and No Systolic Clicks.  Abdomen Inspection Inspection of the abdomen reveals - No Visible peristalsis and No Abnormal pulsations. Umbilicus - No Bleeding, No Urine drainage. Palpation/Percussion Palpation and Percussion of the abdomen reveal - Soft, Non Tender, No Rebound tenderness, No Rigidity (guarding) and No Cutaneous hyperesthesia.  Note: Periumbilical scarring with 3 x 3 cm mass reducing to a smaller fascial defect consistent with periumbilical ventral incisional hernia. Abdomen soft. Not severely distended. No distasis recti. No umbilical or other anterior abdominal wall hernias  Female Genitourinary Sexual Maturity Tanner 5 - Adult hair pattern. Note: No vaginal bleeding nor discharge  Peripheral Vascular Upper  Extremity Inspection - Left - No Cyanotic nailbeds, Not Ischemic. Right - No Cyanotic nailbeds, Not Ischemic.  Neurologic Neurologic evaluation reveals -normal attention span and ability to concentrate, able to name objects and repeat phrases. Appropriate fund of knowledge , normal sensation and normal coordination. Mental Status Affect - not angry, not paranoid. Cranial Nerves-Normal Bilaterally. Gait-Normal.  Neuropsychiatric Mental status exam performed with findings of-able to articulate well  with normal speech/language, rate, volume and coherence, thought content normal with ability to perform basic computations and apply abstract reasoning and no evidence of hallucinations, delusions, obsessions or homicidal/suicidal ideation.  Musculoskeletal Global Assessment Spine, Ribs and Pelvis - no instability, subluxation or laxity. Right Upper Extremity - no instability, subluxation or laxity.  Lymphatic Head & Neck  General Head & Neck Lymphatics: Bilateral - Description - No Localized lymphadenopathy. Axillary  General Axillary Region: Bilateral - Description - No Localized lymphadenopathy. Femoral & Inguinal  Generalized Femoral & Inguinal Lymphatics: Left - Description - No Localized lymphadenopathy. Right - Description - No Localized lymphadenopathy.    Assessment & Plan Ardeth Sportsman(Jori Thrall C. Rosibel Giacobbe MD; 05/20/2018 11:47 AM)  Sherald HessINCISIONAL HERNIA, WITHOUT OBSTRUCTION OR GANGRENE (K43.2) Impression: Periumbilical hernia most likely from prior incision sensitive but reducible.  I think she would benefit from surgical repair. Given the size, her obesity, near an incision and her intense activity requirements; I think she would benefit from mesh reinforcement. Trying to bilateral TAP blocks. Reasonable outpatient candidate. She would like to proceed with this as soon as possible. We will work to coordinate a convenient time. I did caution that she will not return to work  immediately. Most likely light duty for 2 weeks and then unrestricted at 6 weeks. She is a Furniture conservator/restorerphysical education teacher. She is still breast-feeding.  The anatomy & physiology of the abdominal wall was discussed. The pathophysiology of hernias was discussed. Natural history risks without surgery including progeressive enlargement, pain, incarceration, & strangulation was discussed. Contributors to complications such as smoking, obesity, diabetes, prior surgery, etc were discussed.  I feel the risks of no intervention will lead to serious problems that outweigh the operative risks; therefore, I recommended surgery to reduce and repair the hernia. I explained laparoscopic techniques with possible need for an open approach. I noted the probable use of mesh to patch and/or buttress the hernia repair  Risks such as bleeding, infection, abscess, need for further treatment, heart attack, death, and other risks were discussed. I noted a good likelihood this will help address the problem. Goals of post-operative recovery were discussed as well. Possibility that this will not correct all symptoms was explained. I stressed the importance of low-impact activity, aggressive pain control, avoiding constipation, & not pushing through pain to minimize risk of post-operative chronic pain or injury. Possibility of reherniation especially with smoking, obesity, diabetes, immunosuppression, and other health conditions was discussed. We will work to minimize complications.  An educational handout further explaining the pathology & treatment options was given as well. Questions were answered. The patient expresses understanding & wishes to proceed with surgery.  Pt Education - [facility] Hernia Post-Op HCI (Ananiah Maciolek): discussed with patient and provided information. Pt Education - [facility] Pain Control (Anela Bensman) Pt Education - Pamphlet Given - Laparoscopic Hernia Repair: discussed with patient and provided information. Pt  Education - [facility] Mesh education: discussed with patient and provided information.  Ardeth SportsmanSteven C. Eleaner Dibartolo, MD, FACS, MASCRS Gastrointestinal and Minimally Invasive Surgery    [address] [place], [place] [id number] [phone] Main / Paging [phone] Fax

## 2018-07-31 NOTE — Interval H&P Note (Signed)
History and Physical Interval Note:  07/31/2018 7:09 AM  Teresa Roberson  has presented today for surgery, with the diagnosis of Incisional abdominal wall hernia (periumbilical)  The various methods of treatment have been discussed with the patient and family. After consideration of risks, benefits and other options for treatment, the patient has consented to  Procedure(s): LAPAROSCOPIC VENTRAL WALL HERNIA REPAIR ERAS PATHWAY (N/A) INSERTION OF MESH (N/A) as a surgical intervention .  The patient's history has been reviewed, patient examined, no change in status, stable for surgery.  I have reviewed the patient's chart and labs.  Questions were answered to the patient's satisfaction.    I have re-reviewed the the patient's records, history, medications, and allergies.  I have re-examined the patient.  I again discussed intraoperative plans and goals of post-operative recovery.  The patient agrees to proceed.  Teresa Roberson  20-Dec-1980 [id number]  Patient Care Team: Laurann MontanaWhite, Cynthia, MD as PCP - General (Family Medicine) Karie SodaGross, Jadyn Brasher, MD as Consulting Physician (General Surgery)  Patient Active Problem List   Diagnosis Date Noted  . Breastfeeding Oct 2019 05/20/2018  . Incisional hernia 05/20/2018  . H/O cesarean section 03/27/2018  . S/P cesarean section 05/08/2015    Past Medical History:  Diagnosis Date  . Umbilical hernia     Past Surgical History:  Procedure Laterality Date  . APPENDECTOMY  2006  . CESAREAN SECTION N/A 05/08/2015   Procedure: CESAREAN SECTION;  Surgeon: Marcelle OverlieMichelle Grewal, MD;  Location: [facility];  Service: Obstetrics;  Laterality: N/A;  . CESAREAN SECTION WITH BILATERAL TUBAL LIGATION Bilateral 03/27/2018   Procedure: REPEAT CESAREAN SECTION WITH BILATERAL TUBAL LIGATION;  Surgeon: Harold Hedgeomblin, James, MD;  [facility];  Service: Obstetrics;  Laterality: Bilateral;  REPEAT EDC 03/29/18 NKDA Tracey RNFA  . FINGER SURGERY     pinning of right ring  finger--  hardware removal    Social History   Socioeconomic History  . Marital status: Married    Spouse name: Not on file  . Number of children: Not on file  . Years of education: Not on file  . Highest education level: Not on file  Occupational History  . Not on file  Social Needs  . Financial resource strain: Not on file  . Food insecurity:    Worry: Not on file    Inability: Not on file  . Transportation needs:    Medical: Not on file    Non-medical: Not on file  Tobacco Use  . Smoking status: Never Smoker  . Smokeless tobacco: Never Used  Substance and Sexual Activity  . Alcohol use: No  . Drug use: No  . Sexual activity: Not on file  Lifestyle  . Physical activity:    Days per week: Not on file    Minutes per session: Not on file  . Stress: Not on file  Relationships  . Social connections:    Talks on phone: Not on file    Gets together: Not on file    Attends religious service: Not on file    Active member of club or organization: Not on file    Attends meetings of clubs or organizations: Not on file    Relationship status: Not on file  . Intimate partner violence:    Fear of current or ex partner: Not on file    Emotionally abused: Not on file    Physically abused: Not on file    Forced sexual activity: Not on file  Other Topics Concern  .  Not on file  Social History Narrative  . Not on file    Family History  Problem Relation Age of Onset  . Hypertension Father   . Thyroid disease Father   . Lung cancer Paternal Grandfather     Medications Prior to Admission  Medication Sig Dispense Refill Last Dose  . Prenatal Vit-Fe Fumarate-FA (PRENATAL MULTIVITAMIN) TABS tablet Take 1 tablet by mouth daily.    Past Month at Unknown time    Current Facility-Administered Medications  Medication Dose Route Frequency Provider Last Rate Last Dose  . bupivacaine liposome (EXPAREL) 1.3 % injection 266 mg  20 mL Infiltration Once Karie Soda, MD      .  ceFAZolin (ANCEF) IVPB 2g/100 mL premix  2 g Intravenous On Call to OR Karie Soda, MD      . dexamethasone (DECADRON) injection 4 mg  4 mg Intravenous On Call to OR Karie Soda, MD      . lactated ringers infusion   Intravenous Continuous Lucretia Kern, MD         No Known Allergies  BP 112/72   Pulse 78   Temp 97.8 F (36.6 C) (Oral)   Resp 15   Ht 5\' 6"  (1.676 m)   Wt 97.6 kg   LMP 07/12/2018 (Exact Date)   SpO2 100%   BMI 34.72 kg/m   Labs: No results found for this or any previous visit (from the past 48 hour(s)).  Imaging / Studies: No results found.   Ardeth Sportsman, M.D., F.A.C.S. Gastrointestinal and Minimally Invasive Surgery [facility] [address] [place], [place] [address] [phone] Main / Paging  07/31/2018 7:09 AM    Ardeth Sportsman

## 2018-08-01 ENCOUNTER — Encounter (HOSPITAL_COMMUNITY): Payer: Self-pay | Admitting: Surgery

## 2024-09-17 ENCOUNTER — Other Ambulatory Visit: Payer: Self-pay | Admitting: Family Medicine

## 2024-09-17 DIAGNOSIS — E041 Nontoxic single thyroid nodule: Secondary | ICD-10-CM
